# Patient Record
Sex: Male | Born: 1935
Health system: Southern US, Community
[De-identification: ages and names within clinical notes are randomized; demographics above are authoritative.]

## PROBLEM LIST (undated history)

## (undated) DIAGNOSIS — J449 Chronic obstructive pulmonary disease, unspecified: Secondary | ICD-10-CM

## (undated) DIAGNOSIS — I480 Paroxysmal atrial fibrillation: Secondary | ICD-10-CM

## (undated) DIAGNOSIS — I10 Essential (primary) hypertension: Secondary | ICD-10-CM

## (undated) DIAGNOSIS — G629 Polyneuropathy, unspecified: Secondary | ICD-10-CM

## (undated) HISTORY — PX: TONSILLECTOMY: SUR1361

## (undated) HISTORY — DX: Paroxysmal atrial fibrillation: I48.0

## (undated) HISTORY — DX: Essential (primary) hypertension: I10

## (undated) HISTORY — DX: Polyneuropathy, unspecified: G62.9

---

## 2013-02-27 ENCOUNTER — Encounter (INDEPENDENT_AMBULATORY_CARE_PROVIDER_SITE_OTHER): Payer: Self-pay | Admitting: *Deleted

## 2013-03-12 ENCOUNTER — Encounter (HOSPITAL_COMMUNITY): Payer: Self-pay | Admitting: Pharmacy Technician

## 2013-03-12 ENCOUNTER — Encounter (INDEPENDENT_AMBULATORY_CARE_PROVIDER_SITE_OTHER): Payer: Self-pay | Admitting: *Deleted

## 2013-03-12 ENCOUNTER — Encounter (INDEPENDENT_AMBULATORY_CARE_PROVIDER_SITE_OTHER): Payer: Self-pay | Admitting: Internal Medicine

## 2013-03-12 ENCOUNTER — Ambulatory Visit (INDEPENDENT_AMBULATORY_CARE_PROVIDER_SITE_OTHER): Payer: Medicare Other | Admitting: Internal Medicine

## 2013-03-12 ENCOUNTER — Other Ambulatory Visit (INDEPENDENT_AMBULATORY_CARE_PROVIDER_SITE_OTHER): Payer: Self-pay | Admitting: *Deleted

## 2013-03-12 VITALS — BP 156/72 | HR 76 | Ht 70.0 in | Wt 218.1 lb

## 2013-03-12 DIAGNOSIS — R05 Cough: Secondary | ICD-10-CM

## 2013-03-12 DIAGNOSIS — K219 Gastro-esophageal reflux disease without esophagitis: Secondary | ICD-10-CM

## 2013-03-12 DIAGNOSIS — R059 Cough, unspecified: Secondary | ICD-10-CM

## 2013-03-12 DIAGNOSIS — I10 Essential (primary) hypertension: Secondary | ICD-10-CM

## 2013-03-12 NOTE — Patient Instructions (Addendum)
EGD with Dr. Rehman. 

## 2013-03-12 NOTE — Progress Notes (Signed)
Subjective:     Patient ID: Isaiah Brady, male   DOB: 08-07-1933, 79 y.o.   MRN: [id number]  HPIReferred to our office for a cough. He is coughing up mucous. Worse while eating. No cough at bedtime. Symptoms greater than 5 months. No acid reflux. He has tried Mucinex but did not help. His b/p medication was changed but there was no change in his cough.  Sometimes the cough is constant. Appetite is good. NO weight loss. No abdominal pain.  No prior hx of EGD. Recent chest xray was normal.  Last colonoscopy in 2008 Dr. Cleotis Nipper 2008. No acid reflux with the Omeprazole.    12/14/2012 CT chest w/o CM; Left lower lobe cylindrical type bronchiectasis, patchy areas of rt lung ground-glass attenuation and bilateral peripheral interstitial reticulation is identified. Findings are suspicious for early interstitial lung disease.  The small ground-glass attenuation nodules within the rt upper lobe are favored to be the sequela of inflammation or infection. The largest measures 6mm. Coronary artery calcifications.  12/10/12 H and H 13.0 and 40.4, MCV 90, Platelet ct 289 Review of Systems See hpi Current Outpatient Prescriptions  Medication Sig Dispense Refill  . ALPRAZolam (XANAX) 1 MG tablet Take 1 mg by mouth 2 (two) times daily before a meal.      . amitriptyline (ELAVIL) 50 MG tablet Take 50 mg by mouth at bedtime.      Marland Kitchen aspirin 81 MG tablet Take 81 mg by mouth daily.      . fish oil-omega-3 fatty acids 1000 MG capsule Take by mouth 2 (two) times daily before a meal.      . FLUoxetine (PROZAC) 20 MG capsule Take 20 mg by mouth daily.      Marland Kitchen gabapentin (NEURONTIN) 300 MG capsule Take 300 mg by mouth 3 (three) times daily.       . indapamide (LOZOL) 2.5 MG tablet Take 2.5 mg by mouth daily.      Marland Kitchen losartan (COZAAR) 100 MG tablet Take 100 mg by mouth daily.      Marland Kitchen omeprazole (PRILOSEC) 20 MG capsule Take 20 mg by mouth 2 (two) times daily before a meal.      . traMADol-acetaminophen (ULTRACET) 37.5-325  MG per tablet Take 1 tablet by mouth every 6 (six) hours as needed for pain.      . vitamin B-12 (CYANOCOBALAMIN) 1000 MCG tablet Take 1,000 mcg by mouth daily.       No current facility-administered medications for this visit.   Past Medical History  Diagnosis Date  . Hypertension   . Peripheral neuropathy    Past Surgical History  Procedure Laterality Date  . Tonsillectomy     No Known Allergies     Objective:   Physical Exam  Filed Vitals:   03/12/13 0951  BP: 156/72  Pulse: 76  Height: 5\' 10"  (1.778 m)  Weight: 218 lb 1.6 oz (98.93 kg)   Alert and oriented. Skin warm and dry. Oral mucosa is moist.   . Sclera anicteric, conjunctivae is pink. Thyroid not enlarged. No cervical lymphadenopathy. Lungs clear. Heart regular rate and rhythm.  Abdomen is soft. Bowel sounds are positive. No hepatomegaly. No abdominal masses felt. No tenderness.  No edema to lower extremities.        Assessment:   Cough, GERD. PUD needs to be ruled out. I discussed this case with Dr. Karilyn Cota.    Plan:    EGD with Dr. Karilyn Cota

## 2013-03-14 ENCOUNTER — Encounter (INDEPENDENT_AMBULATORY_CARE_PROVIDER_SITE_OTHER): Payer: Self-pay

## 2013-03-23 ENCOUNTER — Encounter (HOSPITAL_COMMUNITY)
Admission: RE | Disposition: A | Discharge status: Home / Self Care | Payer: Self-pay | Source: Ambulatory Visit | Attending: Internal Medicine

## 2013-03-23 ENCOUNTER — Encounter (HOSPITAL_COMMUNITY): Payer: Self-pay | Admitting: *Deleted

## 2013-03-23 ENCOUNTER — Ambulatory Visit (HOSPITAL_COMMUNITY)
Admission: RE | Admit: 2013-03-23 | Discharge: 2013-03-23 | Disposition: A | Discharge status: Home / Self Care | Payer: Medicare Other | Source: Ambulatory Visit | Attending: Internal Medicine | Admitting: Internal Medicine

## 2013-03-23 DIAGNOSIS — K2100 Gastro-esophageal reflux disease with esophagitis, without bleeding: Secondary | ICD-10-CM | POA: Insufficient documentation

## 2013-03-23 DIAGNOSIS — Z79899 Other long term (current) drug therapy: Secondary | ICD-10-CM | POA: Insufficient documentation

## 2013-03-23 DIAGNOSIS — R1310 Dysphagia, unspecified: Secondary | ICD-10-CM | POA: Insufficient documentation

## 2013-03-23 DIAGNOSIS — G609 Hereditary and idiopathic neuropathy, unspecified: Secondary | ICD-10-CM | POA: Insufficient documentation

## 2013-03-23 DIAGNOSIS — K209 Esophagitis, unspecified without bleeding: Secondary | ICD-10-CM

## 2013-03-23 DIAGNOSIS — R05 Cough: Secondary | ICD-10-CM

## 2013-03-23 DIAGNOSIS — K219 Gastro-esophageal reflux disease without esophagitis: Secondary | ICD-10-CM

## 2013-03-23 DIAGNOSIS — Z7982 Long term (current) use of aspirin: Secondary | ICD-10-CM | POA: Insufficient documentation

## 2013-03-23 DIAGNOSIS — R059 Cough, unspecified: Secondary | ICD-10-CM

## 2013-03-23 DIAGNOSIS — I10 Essential (primary) hypertension: Secondary | ICD-10-CM | POA: Insufficient documentation

## 2013-03-23 DIAGNOSIS — R12 Heartburn: Secondary | ICD-10-CM | POA: Insufficient documentation

## 2013-03-23 HISTORY — PX: ESOPHAGOGASTRODUODENOSCOPY: SHX5428

## 2013-03-23 SURGERY — EGD (ESOPHAGOGASTRODUODENOSCOPY)
Anesthesia: Moderate Sedation

## 2013-03-23 MED ORDER — LEVOFLOXACIN 500 MG PO TABS: 500.0000 mg | ORAL_TABLET | Freq: Every day | ORAL | Status: DC

## 2013-03-23 MED ORDER — SODIUM CHLORIDE 0.9 % IV SOLN
INTRAVENOUS | Status: DC
  Administered 2013-03-23: 11:00:00 via INTRAVENOUS

## 2013-03-23 MED ORDER — MIDAZOLAM HCL 5 MG/5ML IJ SOLN
INTRAMUSCULAR | Status: AC
  Filled 2013-03-23: qty 10

## 2013-03-23 MED ORDER — STERILE WATER FOR IRRIGATION IR SOLN
Status: DC | PRN
  Administered 2013-03-23: 11:00:00

## 2013-03-23 MED ORDER — MIDAZOLAM HCL 5 MG/5ML IJ SOLN
INTRAMUSCULAR | Status: DC | PRN
  Administered 2013-03-23 (×2): 2 mg via INTRAVENOUS

## 2013-03-23 MED ORDER — MEPERIDINE HCL 25 MG/ML IJ SOLN
INTRAMUSCULAR | Status: DC | PRN
  Administered 2013-03-23: 25 mg via INTRAVENOUS

## 2013-03-23 MED ORDER — MEPERIDINE HCL 50 MG/ML IJ SOLN
INTRAMUSCULAR | Status: AC
  Filled 2013-03-23: qty 1

## 2013-03-23 NOTE — Op Note (Signed)
EGD PROCEDURE REPORT  PATIENT:  Isaiah Brady [facility]  MR#:  [id number] Birthdate:  01-Sep-1933, 79 y.o., male Endoscopist:  Dr. Malissa Hippo, MD Referred By:  Dr. Donzetta Sprung, MD  Procedure Date: 03/23/2013  Procedure:   EGD  Indications:  Patient is 79 year old Caucasian male who has a chronic cough. He also has GERD. His heartburn has responded therapy for his cough has not. He's been tried on Mucinex and he was switched from ACE inhibitor to another medication but his cough persists. He is undergoing diagnostic EGD. If he has a ring or stricture is esophagus would be dilated. Patient is agreeable.            Informed Consent:  The risks, benefits, alternatives & imponderables which include, but are not limited to, bleeding, infection, perforation, drug reaction and potential missed lesion have been reviewed.  The potential for biopsy, lesion removal, esophageal dilation, etc. have also been discussed.  Questions have been answered.  All parties agreeable.  Please see history & physical in medical record for more information.  Medications:  Demerol 25 mg IV Versed 4 mg IV Cetacaine spray topically for oropharyngeal anesthesia  Description of procedure:  The endoscope was introduced through the mouth and advanced to the second portion of the duodenum without difficulty or limitations. The mucosal surfaces were surveyed very carefully during advancement of the scope and upon withdrawal.  Findings:  Laryngeal inlet appeared to be unremarkable. Creamy secretions noted in the hypopharynx. Esophagus:  Mucosa of the proximal middle and distal third was normal with exception of erythema at GE junction. No ring or stricture was present. GEJ:  39 cm Stomach:  Stomach was empty and distended very well with insufflation. Folds in the proximal stomach are normal. Examination of mucosa at gastric body, antrum, pylorus, angularis fundus and cardia was normal. Duodenum:  Normal bulbar and post bulbar  mucosa.  Therapeutic/Diagnostic Maneuvers Performed:  None  Complications:  None  Impression: Mild changes of reflux esophagitis limited to GE junction. Normal examination of stomach first and second part of the duodenum. Creamy secretions noted in hypopharynx. ? Source lungs.  Recommendations:  Patient will continue anti-reflux measures and omeprazole at 20 mg twice a day. Will initiate antibiotic therapy after consultation with Dr. Donzetta Sprung and if he does not respond he will need consultation with pulmonologist.  Lionel December U  03/23/2013  11:28 AM  CC: Dr. Donzetta Sprung, MD & Dr. Bonnetta Barry ref. provider found

## 2013-03-23 NOTE — H&P (Signed)
Isaiah Brady is an 79 y.o. male.   Chief Complaint: Patient's here for EGD and possible ED. HPI: Patient is 79 and showed Caucasian male who presents with long history of coughing spells. He has not responded therapy. He was having intermittent heartburn which has resolved with omeprazole but his cough has not improved. He complains of a tickle in his throat. At times he has difficulty swallowing food. He feels that go slowly passes suprasternal area. He denies nausea vomiting abdominal pain or melena. When he coughs he drinks about clear fluid.  Past Medical History  Diagnosis Date  . Hypertension   . Peripheral neuropathy     Past Surgical History  Procedure Laterality Date  . Tonsillectomy      History reviewed. No pertinent family history. Social History:  reports that he has quit smoking. He does not have any smokeless tobacco history on file. He reports that he does not drink alcohol or use illicit drugs.  Allergies: No Known Allergies  Medications Prior to Admission  Medication Sig Dispense Refill  . ALPRAZolam (XANAX) 1 MG tablet Take 1 mg by mouth 2 (two) times daily before a meal.      . amitriptyline (ELAVIL) 50 MG tablet Take 50 mg by mouth at bedtime.      Marland Kitchen aspirin 81 MG tablet Take 81 mg by mouth daily.      . fish oil-omega-3 fatty acids 1000 MG capsule Take by mouth 2 (two) times daily before a meal.      . FLUoxetine (PROZAC) 20 MG capsule Take 20 mg by mouth daily.      Marland Kitchen gabapentin (NEURONTIN) 300 MG capsule Take 300 mg by mouth 3 (three) times daily.       . indapamide (LOZOL) 2.5 MG tablet Take 2.5 mg by mouth daily.      Marland Kitchen losartan (COZAAR) 100 MG tablet Take 100 mg by mouth daily.      Marland Kitchen omeprazole (PRILOSEC) 20 MG capsule Take 20 mg by mouth 2 (two) times daily before a meal.      . traMADol-acetaminophen (ULTRACET) 37.5-325 MG per tablet Take 1 tablet by mouth every 6 (six) hours as needed for pain.      . vitamin B-12 (CYANOCOBALAMIN) 1000 MCG tablet Take  1,000 mcg by mouth daily.        No results found for this or any previous visit (from the past 48 hour(s)). No results found.  ROS  Blood pressure 150/81, temperature 97.8 F (36.6 C), temperature source Oral, resp. rate 22, height 5\' 10"  (1.778 m), weight 215 lb (97.523 kg), SpO2 94.00%. Physical Exam  Constitutional: He appears well-developed and well-nourished.  HENT:  Mouth/Throat: Oropharynx is clear and moist.  Eyes: Conjunctivae are normal. No scleral icterus.  Neck: No thyromegaly present.  Cardiovascular: Normal rate, regular rhythm and normal heart sounds.   No murmur heard. Respiratory: Effort normal and breath sounds normal.  GI: Soft. He exhibits no distension and no mass. There is no tenderness.  Musculoskeletal: He exhibits no edema.  Lymphadenopathy:    He has no cervical adenopathy.  Neurological: He is alert.  Skin: Skin is warm and dry.     Assessment/Plan Chronic cough unresponsive to PPI. EGD, possible ED.  Isaiah Brady U 03/23/2013, 11:02 AM

## 2013-03-26 ENCOUNTER — Encounter (HOSPITAL_COMMUNITY): Payer: Self-pay | Admitting: Internal Medicine

## 2016-01-19 DIAGNOSIS — I10 Essential (primary) hypertension: Secondary | ICD-10-CM | POA: Diagnosis not present

## 2016-01-19 DIAGNOSIS — R7301 Impaired fasting glucose: Secondary | ICD-10-CM | POA: Diagnosis not present

## 2016-01-19 DIAGNOSIS — E782 Mixed hyperlipidemia: Secondary | ICD-10-CM | POA: Diagnosis not present

## 2016-01-22 DIAGNOSIS — K219 Gastro-esophageal reflux disease without esophagitis: Secondary | ICD-10-CM | POA: Diagnosis not present

## 2016-01-22 DIAGNOSIS — E782 Mixed hyperlipidemia: Secondary | ICD-10-CM | POA: Diagnosis not present

## 2016-01-22 DIAGNOSIS — I10 Essential (primary) hypertension: Secondary | ICD-10-CM | POA: Diagnosis not present

## 2016-01-22 DIAGNOSIS — Z1212 Encounter for screening for malignant neoplasm of rectum: Secondary | ICD-10-CM | POA: Diagnosis not present

## 2016-01-22 DIAGNOSIS — E8881 Metabolic syndrome: Secondary | ICD-10-CM | POA: Diagnosis not present

## 2016-01-22 DIAGNOSIS — N183 Chronic kidney disease, stage 3 (moderate): Secondary | ICD-10-CM | POA: Diagnosis not present

## 2016-01-22 DIAGNOSIS — Z6834 Body mass index (BMI) 34.0-34.9, adult: Secondary | ICD-10-CM | POA: Diagnosis not present

## 2016-01-22 DIAGNOSIS — F331 Major depressive disorder, recurrent, moderate: Secondary | ICD-10-CM | POA: Diagnosis not present

## 2016-05-19 DIAGNOSIS — Z6835 Body mass index (BMI) 35.0-35.9, adult: Secondary | ICD-10-CM | POA: Diagnosis not present

## 2016-05-19 DIAGNOSIS — E8881 Metabolic syndrome: Secondary | ICD-10-CM | POA: Diagnosis not present

## 2016-05-19 DIAGNOSIS — I10 Essential (primary) hypertension: Secondary | ICD-10-CM | POA: Diagnosis not present

## 2016-05-19 DIAGNOSIS — K219 Gastro-esophageal reflux disease without esophagitis: Secondary | ICD-10-CM | POA: Diagnosis not present

## 2016-05-19 DIAGNOSIS — L400 Psoriasis vulgaris: Secondary | ICD-10-CM | POA: Diagnosis not present

## 2016-05-19 DIAGNOSIS — E782 Mixed hyperlipidemia: Secondary | ICD-10-CM | POA: Diagnosis not present

## 2016-05-19 DIAGNOSIS — N183 Chronic kidney disease, stage 3 (moderate): Secondary | ICD-10-CM | POA: Diagnosis not present

## 2016-05-19 DIAGNOSIS — F331 Major depressive disorder, recurrent, moderate: Secondary | ICD-10-CM | POA: Diagnosis not present

## 2016-08-24 DIAGNOSIS — H35033 Hypertensive retinopathy, bilateral: Secondary | ICD-10-CM | POA: Diagnosis not present

## 2016-08-24 DIAGNOSIS — H25012 Cortical age-related cataract, left eye: Secondary | ICD-10-CM | POA: Diagnosis not present

## 2016-08-24 DIAGNOSIS — H2513 Age-related nuclear cataract, bilateral: Secondary | ICD-10-CM | POA: Diagnosis not present

## 2016-08-24 DIAGNOSIS — H25013 Cortical age-related cataract, bilateral: Secondary | ICD-10-CM | POA: Diagnosis not present

## 2016-08-24 DIAGNOSIS — H2512 Age-related nuclear cataract, left eye: Secondary | ICD-10-CM | POA: Diagnosis not present

## 2016-08-26 DIAGNOSIS — Z23 Encounter for immunization: Secondary | ICD-10-CM | POA: Diagnosis not present

## 2016-09-02 DIAGNOSIS — H2513 Age-related nuclear cataract, bilateral: Secondary | ICD-10-CM | POA: Diagnosis not present

## 2016-09-02 DIAGNOSIS — H25012 Cortical age-related cataract, left eye: Secondary | ICD-10-CM | POA: Diagnosis not present

## 2016-09-02 DIAGNOSIS — H2512 Age-related nuclear cataract, left eye: Secondary | ICD-10-CM | POA: Diagnosis not present

## 2016-09-02 DIAGNOSIS — H25013 Cortical age-related cataract, bilateral: Secondary | ICD-10-CM | POA: Diagnosis not present

## 2016-09-25 DIAGNOSIS — R0902 Hypoxemia: Secondary | ICD-10-CM | POA: Diagnosis not present

## 2016-09-25 DIAGNOSIS — I509 Heart failure, unspecified: Secondary | ICD-10-CM | POA: Diagnosis not present

## 2016-09-25 DIAGNOSIS — L03115 Cellulitis of right lower limb: Secondary | ICD-10-CM | POA: Diagnosis not present

## 2016-09-25 DIAGNOSIS — R6883 Chills (without fever): Secondary | ICD-10-CM | POA: Diagnosis not present

## 2016-09-25 DIAGNOSIS — F339 Major depressive disorder, recurrent, unspecified: Secondary | ICD-10-CM | POA: Diagnosis not present

## 2016-09-25 DIAGNOSIS — I129 Hypertensive chronic kidney disease with stage 1 through stage 4 chronic kidney disease, or unspecified chronic kidney disease: Secondary | ICD-10-CM | POA: Diagnosis not present

## 2016-09-25 DIAGNOSIS — J9601 Acute respiratory failure with hypoxia: Secondary | ICD-10-CM | POA: Diagnosis not present

## 2016-09-25 DIAGNOSIS — R0682 Tachypnea, not elsewhere classified: Secondary | ICD-10-CM | POA: Diagnosis not present

## 2016-09-25 DIAGNOSIS — E785 Hyperlipidemia, unspecified: Secondary | ICD-10-CM | POA: Diagnosis not present

## 2016-09-25 DIAGNOSIS — R6520 Severe sepsis without septic shock: Secondary | ICD-10-CM | POA: Diagnosis not present

## 2016-09-25 DIAGNOSIS — J111 Influenza due to unidentified influenza virus with other respiratory manifestations: Secondary | ICD-10-CM | POA: Diagnosis not present

## 2016-09-25 DIAGNOSIS — A419 Sepsis, unspecified organism: Secondary | ICD-10-CM | POA: Diagnosis not present

## 2016-09-25 DIAGNOSIS — J449 Chronic obstructive pulmonary disease, unspecified: Secondary | ICD-10-CM | POA: Diagnosis not present

## 2016-09-25 DIAGNOSIS — L03116 Cellulitis of left lower limb: Secondary | ICD-10-CM | POA: Diagnosis not present

## 2016-09-25 DIAGNOSIS — L0291 Cutaneous abscess, unspecified: Secondary | ICD-10-CM | POA: Diagnosis not present

## 2016-09-25 DIAGNOSIS — B954 Other streptococcus as the cause of diseases classified elsewhere: Secondary | ICD-10-CM | POA: Diagnosis not present

## 2016-09-25 DIAGNOSIS — N183 Chronic kidney disease, stage 3 (moderate): Secondary | ICD-10-CM | POA: Diagnosis not present

## 2016-09-25 DIAGNOSIS — J189 Pneumonia, unspecified organism: Secondary | ICD-10-CM | POA: Diagnosis not present

## 2016-09-26 DIAGNOSIS — L0291 Cutaneous abscess, unspecified: Secondary | ICD-10-CM | POA: Diagnosis not present

## 2016-09-26 DIAGNOSIS — J9601 Acute respiratory failure with hypoxia: Secondary | ICD-10-CM | POA: Diagnosis not present

## 2016-09-27 DIAGNOSIS — J9601 Acute respiratory failure with hypoxia: Secondary | ICD-10-CM | POA: Diagnosis not present

## 2016-09-27 DIAGNOSIS — L0291 Cutaneous abscess, unspecified: Secondary | ICD-10-CM | POA: Diagnosis not present

## 2016-09-28 DIAGNOSIS — J9601 Acute respiratory failure with hypoxia: Secondary | ICD-10-CM | POA: Diagnosis not present

## 2016-09-28 DIAGNOSIS — J449 Chronic obstructive pulmonary disease, unspecified: Secondary | ICD-10-CM | POA: Diagnosis not present

## 2016-09-28 DIAGNOSIS — L0291 Cutaneous abscess, unspecified: Secondary | ICD-10-CM | POA: Diagnosis not present

## 2016-09-29 DIAGNOSIS — G609 Hereditary and idiopathic neuropathy, unspecified: Secondary | ICD-10-CM | POA: Diagnosis not present

## 2016-09-29 DIAGNOSIS — L309 Dermatitis, unspecified: Secondary | ICD-10-CM | POA: Diagnosis not present

## 2016-09-29 DIAGNOSIS — L03116 Cellulitis of left lower limb: Secondary | ICD-10-CM | POA: Diagnosis not present

## 2016-09-29 DIAGNOSIS — M1A9XX0 Chronic gout, unspecified, without tophus (tophi): Secondary | ICD-10-CM | POA: Diagnosis not present

## 2016-09-29 DIAGNOSIS — I129 Hypertensive chronic kidney disease with stage 1 through stage 4 chronic kidney disease, or unspecified chronic kidney disease: Secondary | ICD-10-CM | POA: Diagnosis not present

## 2016-09-29 DIAGNOSIS — L03115 Cellulitis of right lower limb: Secondary | ICD-10-CM | POA: Diagnosis not present

## 2016-09-29 DIAGNOSIS — F3340 Major depressive disorder, recurrent, in remission, unspecified: Secondary | ICD-10-CM | POA: Diagnosis not present

## 2016-09-29 DIAGNOSIS — N183 Chronic kidney disease, stage 3 (moderate): Secondary | ICD-10-CM | POA: Diagnosis not present

## 2016-09-29 DIAGNOSIS — J449 Chronic obstructive pulmonary disease, unspecified: Secondary | ICD-10-CM | POA: Diagnosis not present

## 2016-09-29 DIAGNOSIS — K219 Gastro-esophageal reflux disease without esophagitis: Secondary | ICD-10-CM | POA: Diagnosis not present

## 2016-10-25 DIAGNOSIS — Z9189 Other specified personal risk factors, not elsewhere classified: Secondary | ICD-10-CM | POA: Diagnosis not present

## 2016-10-25 DIAGNOSIS — N183 Chronic kidney disease, stage 3 (moderate): Secondary | ICD-10-CM | POA: Diagnosis not present

## 2016-10-25 DIAGNOSIS — I10 Essential (primary) hypertension: Secondary | ICD-10-CM | POA: Diagnosis not present

## 2016-10-25 DIAGNOSIS — E8881 Metabolic syndrome: Secondary | ICD-10-CM | POA: Diagnosis not present

## 2016-10-25 DIAGNOSIS — K210 Gastro-esophageal reflux disease with esophagitis: Secondary | ICD-10-CM | POA: Diagnosis not present

## 2016-10-25 DIAGNOSIS — E782 Mixed hyperlipidemia: Secondary | ICD-10-CM | POA: Diagnosis not present

## 2016-10-25 DIAGNOSIS — R7301 Impaired fasting glucose: Secondary | ICD-10-CM | POA: Diagnosis not present

## 2016-10-26 DIAGNOSIS — H2512 Age-related nuclear cataract, left eye: Secondary | ICD-10-CM | POA: Diagnosis not present

## 2016-10-26 DIAGNOSIS — H25012 Cortical age-related cataract, left eye: Secondary | ICD-10-CM | POA: Diagnosis not present

## 2016-10-26 DIAGNOSIS — H25812 Combined forms of age-related cataract, left eye: Secondary | ICD-10-CM | POA: Diagnosis not present

## 2016-10-28 DIAGNOSIS — F331 Major depressive disorder, recurrent, moderate: Secondary | ICD-10-CM | POA: Diagnosis not present

## 2016-10-28 DIAGNOSIS — I10 Essential (primary) hypertension: Secondary | ICD-10-CM | POA: Diagnosis not present

## 2016-10-28 DIAGNOSIS — K219 Gastro-esophageal reflux disease without esophagitis: Secondary | ICD-10-CM | POA: Diagnosis not present

## 2016-10-28 DIAGNOSIS — Z6834 Body mass index (BMI) 34.0-34.9, adult: Secondary | ICD-10-CM | POA: Diagnosis not present

## 2016-10-28 DIAGNOSIS — Z1212 Encounter for screening for malignant neoplasm of rectum: Secondary | ICD-10-CM | POA: Diagnosis not present

## 2016-10-28 DIAGNOSIS — J449 Chronic obstructive pulmonary disease, unspecified: Secondary | ICD-10-CM | POA: Diagnosis not present

## 2016-10-28 DIAGNOSIS — L03116 Cellulitis of left lower limb: Secondary | ICD-10-CM | POA: Diagnosis not present

## 2016-10-28 DIAGNOSIS — Z0001 Encounter for general adult medical examination with abnormal findings: Secondary | ICD-10-CM | POA: Diagnosis not present

## 2016-10-28 DIAGNOSIS — E8881 Metabolic syndrome: Secondary | ICD-10-CM | POA: Diagnosis not present

## 2016-10-28 DIAGNOSIS — G4733 Obstructive sleep apnea (adult) (pediatric): Secondary | ICD-10-CM | POA: Diagnosis not present

## 2016-10-28 DIAGNOSIS — E782 Mixed hyperlipidemia: Secondary | ICD-10-CM | POA: Diagnosis not present

## 2016-11-10 DIAGNOSIS — Z6834 Body mass index (BMI) 34.0-34.9, adult: Secondary | ICD-10-CM | POA: Diagnosis not present

## 2016-11-10 DIAGNOSIS — R05 Cough: Secondary | ICD-10-CM | POA: Diagnosis not present

## 2016-11-28 DIAGNOSIS — J449 Chronic obstructive pulmonary disease, unspecified: Secondary | ICD-10-CM | POA: Diagnosis not present

## 2016-11-30 DIAGNOSIS — I10 Essential (primary) hypertension: Secondary | ICD-10-CM | POA: Diagnosis not present

## 2016-11-30 DIAGNOSIS — R7301 Impaired fasting glucose: Secondary | ICD-10-CM | POA: Diagnosis not present

## 2016-11-30 DIAGNOSIS — K219 Gastro-esophageal reflux disease without esophagitis: Secondary | ICD-10-CM | POA: Diagnosis not present

## 2016-11-30 DIAGNOSIS — L400 Psoriasis vulgaris: Secondary | ICD-10-CM | POA: Diagnosis not present

## 2016-11-30 DIAGNOSIS — E8881 Metabolic syndrome: Secondary | ICD-10-CM | POA: Diagnosis not present

## 2016-11-30 DIAGNOSIS — F331 Major depressive disorder, recurrent, moderate: Secondary | ICD-10-CM | POA: Diagnosis not present

## 2016-11-30 DIAGNOSIS — N183 Chronic kidney disease, stage 3 (moderate): Secondary | ICD-10-CM | POA: Diagnosis not present

## 2016-11-30 DIAGNOSIS — E782 Mixed hyperlipidemia: Secondary | ICD-10-CM | POA: Diagnosis not present

## 2016-12-29 DIAGNOSIS — J449 Chronic obstructive pulmonary disease, unspecified: Secondary | ICD-10-CM | POA: Diagnosis not present

## 2017-01-20 DIAGNOSIS — L03116 Cellulitis of left lower limb: Secondary | ICD-10-CM | POA: Diagnosis not present

## 2017-01-20 DIAGNOSIS — Z6834 Body mass index (BMI) 34.0-34.9, adult: Secondary | ICD-10-CM | POA: Diagnosis not present

## 2017-01-20 DIAGNOSIS — I10 Essential (primary) hypertension: Secondary | ICD-10-CM | POA: Diagnosis not present

## 2017-01-20 DIAGNOSIS — L03115 Cellulitis of right lower limb: Secondary | ICD-10-CM | POA: Diagnosis not present

## 2017-01-26 DIAGNOSIS — J449 Chronic obstructive pulmonary disease, unspecified: Secondary | ICD-10-CM | POA: Diagnosis not present

## 2017-01-28 DIAGNOSIS — R531 Weakness: Secondary | ICD-10-CM | POA: Diagnosis not present

## 2017-01-28 DIAGNOSIS — Z87891 Personal history of nicotine dependence: Secondary | ICD-10-CM | POA: Diagnosis not present

## 2017-01-28 DIAGNOSIS — A419 Sepsis, unspecified organism: Secondary | ICD-10-CM | POA: Diagnosis not present

## 2017-01-28 DIAGNOSIS — R05 Cough: Secondary | ICD-10-CM | POA: Diagnosis not present

## 2017-01-28 DIAGNOSIS — R404 Transient alteration of awareness: Secondary | ICD-10-CM | POA: Diagnosis not present

## 2017-01-28 DIAGNOSIS — J156 Pneumonia due to other aerobic Gram-negative bacteria: Secondary | ICD-10-CM | POA: Diagnosis not present

## 2017-01-28 DIAGNOSIS — R509 Fever, unspecified: Secondary | ICD-10-CM | POA: Diagnosis not present

## 2017-01-28 DIAGNOSIS — J189 Pneumonia, unspecified organism: Secondary | ICD-10-CM | POA: Diagnosis not present

## 2017-01-28 DIAGNOSIS — R0902 Hypoxemia: Secondary | ICD-10-CM | POA: Diagnosis not present

## 2017-01-29 DIAGNOSIS — J156 Pneumonia due to other aerobic Gram-negative bacteria: Secondary | ICD-10-CM | POA: Diagnosis not present

## 2017-01-29 DIAGNOSIS — A419 Sepsis, unspecified organism: Secondary | ICD-10-CM | POA: Diagnosis not present

## 2017-01-30 DIAGNOSIS — J156 Pneumonia due to other aerobic Gram-negative bacteria: Secondary | ICD-10-CM | POA: Diagnosis not present

## 2017-01-30 DIAGNOSIS — A419 Sepsis, unspecified organism: Secondary | ICD-10-CM | POA: Diagnosis not present

## 2017-01-31 DIAGNOSIS — A419 Sepsis, unspecified organism: Secondary | ICD-10-CM | POA: Diagnosis not present

## 2017-01-31 DIAGNOSIS — J156 Pneumonia due to other aerobic Gram-negative bacteria: Secondary | ICD-10-CM | POA: Diagnosis not present

## 2017-02-07 DIAGNOSIS — J189 Pneumonia, unspecified organism: Secondary | ICD-10-CM | POA: Diagnosis not present

## 2017-02-26 DIAGNOSIS — J449 Chronic obstructive pulmonary disease, unspecified: Secondary | ICD-10-CM | POA: Diagnosis not present

## 2017-03-02 DIAGNOSIS — H109 Unspecified conjunctivitis: Secondary | ICD-10-CM | POA: Diagnosis not present

## 2017-03-02 DIAGNOSIS — H6123 Impacted cerumen, bilateral: Secondary | ICD-10-CM | POA: Diagnosis not present

## 2017-03-02 DIAGNOSIS — Z6833 Body mass index (BMI) 33.0-33.9, adult: Secondary | ICD-10-CM | POA: Diagnosis not present

## 2017-03-02 DIAGNOSIS — J189 Pneumonia, unspecified organism: Secondary | ICD-10-CM | POA: Diagnosis not present

## 2017-03-25 DIAGNOSIS — E782 Mixed hyperlipidemia: Secondary | ICD-10-CM | POA: Diagnosis not present

## 2017-03-25 DIAGNOSIS — E8881 Metabolic syndrome: Secondary | ICD-10-CM | POA: Diagnosis not present

## 2017-03-25 DIAGNOSIS — G4733 Obstructive sleep apnea (adult) (pediatric): Secondary | ICD-10-CM | POA: Diagnosis not present

## 2017-03-25 DIAGNOSIS — F331 Major depressive disorder, recurrent, moderate: Secondary | ICD-10-CM | POA: Diagnosis not present

## 2017-03-28 DIAGNOSIS — J449 Chronic obstructive pulmonary disease, unspecified: Secondary | ICD-10-CM | POA: Diagnosis not present

## 2017-03-30 DIAGNOSIS — E782 Mixed hyperlipidemia: Secondary | ICD-10-CM | POA: Diagnosis not present

## 2017-03-30 DIAGNOSIS — I10 Essential (primary) hypertension: Secondary | ICD-10-CM | POA: Diagnosis not present

## 2017-03-30 DIAGNOSIS — F331 Major depressive disorder, recurrent, moderate: Secondary | ICD-10-CM | POA: Diagnosis not present

## 2017-03-30 DIAGNOSIS — E8881 Metabolic syndrome: Secondary | ICD-10-CM | POA: Diagnosis not present

## 2017-04-22 DIAGNOSIS — Z6830 Body mass index (BMI) 30.0-30.9, adult: Secondary | ICD-10-CM | POA: Diagnosis not present

## 2017-04-22 DIAGNOSIS — L03116 Cellulitis of left lower limb: Secondary | ICD-10-CM | POA: Diagnosis not present

## 2017-04-22 DIAGNOSIS — L03115 Cellulitis of right lower limb: Secondary | ICD-10-CM | POA: Diagnosis not present

## 2017-04-28 DIAGNOSIS — J449 Chronic obstructive pulmonary disease, unspecified: Secondary | ICD-10-CM | POA: Diagnosis not present

## 2017-05-28 DIAGNOSIS — J449 Chronic obstructive pulmonary disease, unspecified: Secondary | ICD-10-CM | POA: Diagnosis not present

## 2017-06-13 DIAGNOSIS — L03115 Cellulitis of right lower limb: Secondary | ICD-10-CM | POA: Diagnosis not present

## 2017-06-13 DIAGNOSIS — L03116 Cellulitis of left lower limb: Secondary | ICD-10-CM | POA: Diagnosis not present

## 2017-06-13 DIAGNOSIS — Z6830 Body mass index (BMI) 30.0-30.9, adult: Secondary | ICD-10-CM | POA: Diagnosis not present

## 2017-06-20 DIAGNOSIS — E8881 Metabolic syndrome: Secondary | ICD-10-CM | POA: Diagnosis not present

## 2017-06-20 DIAGNOSIS — E782 Mixed hyperlipidemia: Secondary | ICD-10-CM | POA: Diagnosis not present

## 2017-06-20 DIAGNOSIS — I10 Essential (primary) hypertension: Secondary | ICD-10-CM | POA: Diagnosis not present

## 2017-06-20 DIAGNOSIS — N183 Chronic kidney disease, stage 3 (moderate): Secondary | ICD-10-CM | POA: Diagnosis not present

## 2017-06-28 DIAGNOSIS — J449 Chronic obstructive pulmonary disease, unspecified: Secondary | ICD-10-CM | POA: Diagnosis not present

## 2017-07-13 DIAGNOSIS — Z6829 Body mass index (BMI) 29.0-29.9, adult: Secondary | ICD-10-CM | POA: Diagnosis not present

## 2017-07-13 DIAGNOSIS — L400 Psoriasis vulgaris: Secondary | ICD-10-CM | POA: Diagnosis not present

## 2017-07-29 DIAGNOSIS — J449 Chronic obstructive pulmonary disease, unspecified: Secondary | ICD-10-CM | POA: Diagnosis not present

## 2017-08-01 DIAGNOSIS — E782 Mixed hyperlipidemia: Secondary | ICD-10-CM | POA: Diagnosis not present

## 2017-08-01 DIAGNOSIS — I10 Essential (primary) hypertension: Secondary | ICD-10-CM | POA: Diagnosis not present

## 2017-08-01 DIAGNOSIS — E1165 Type 2 diabetes mellitus with hyperglycemia: Secondary | ICD-10-CM | POA: Diagnosis not present

## 2017-08-01 DIAGNOSIS — E6609 Other obesity due to excess calories: Secondary | ICD-10-CM | POA: Diagnosis not present

## 2017-08-05 DIAGNOSIS — E782 Mixed hyperlipidemia: Secondary | ICD-10-CM | POA: Diagnosis not present

## 2017-08-05 DIAGNOSIS — N183 Chronic kidney disease, stage 3 (moderate): Secondary | ICD-10-CM | POA: Diagnosis not present

## 2017-08-05 DIAGNOSIS — I10 Essential (primary) hypertension: Secondary | ICD-10-CM | POA: Diagnosis not present

## 2017-08-05 DIAGNOSIS — E8881 Metabolic syndrome: Secondary | ICD-10-CM | POA: Diagnosis not present

## 2017-08-28 DIAGNOSIS — J449 Chronic obstructive pulmonary disease, unspecified: Secondary | ICD-10-CM | POA: Diagnosis not present

## 2017-09-28 DIAGNOSIS — J449 Chronic obstructive pulmonary disease, unspecified: Secondary | ICD-10-CM | POA: Diagnosis not present

## 2017-10-28 DIAGNOSIS — J449 Chronic obstructive pulmonary disease, unspecified: Secondary | ICD-10-CM | POA: Diagnosis not present

## 2017-11-28 DIAGNOSIS — J449 Chronic obstructive pulmonary disease, unspecified: Secondary | ICD-10-CM | POA: Diagnosis not present

## 2017-11-30 DIAGNOSIS — E039 Hypothyroidism, unspecified: Secondary | ICD-10-CM | POA: Diagnosis not present

## 2017-11-30 DIAGNOSIS — E1165 Type 2 diabetes mellitus with hyperglycemia: Secondary | ICD-10-CM | POA: Diagnosis not present

## 2017-11-30 DIAGNOSIS — E8881 Metabolic syndrome: Secondary | ICD-10-CM | POA: Diagnosis not present

## 2017-11-30 DIAGNOSIS — E782 Mixed hyperlipidemia: Secondary | ICD-10-CM | POA: Diagnosis not present

## 2017-12-06 DIAGNOSIS — E8881 Metabolic syndrome: Secondary | ICD-10-CM | POA: Diagnosis not present

## 2017-12-06 DIAGNOSIS — Z23 Encounter for immunization: Secondary | ICD-10-CM | POA: Diagnosis not present

## 2017-12-06 DIAGNOSIS — Z0001 Encounter for general adult medical examination with abnormal findings: Secondary | ICD-10-CM | POA: Diagnosis not present

## 2017-12-06 DIAGNOSIS — E782 Mixed hyperlipidemia: Secondary | ICD-10-CM | POA: Diagnosis not present

## 2017-12-29 DIAGNOSIS — J449 Chronic obstructive pulmonary disease, unspecified: Secondary | ICD-10-CM | POA: Diagnosis not present

## 2018-01-26 DIAGNOSIS — J449 Chronic obstructive pulmonary disease, unspecified: Secondary | ICD-10-CM | POA: Diagnosis not present

## 2018-02-26 DIAGNOSIS — J449 Chronic obstructive pulmonary disease, unspecified: Secondary | ICD-10-CM | POA: Diagnosis not present

## 2018-03-06 DIAGNOSIS — R296 Repeated falls: Secondary | ICD-10-CM | POA: Diagnosis not present

## 2018-03-06 DIAGNOSIS — J9611 Chronic respiratory failure with hypoxia: Secondary | ICD-10-CM | POA: Diagnosis not present

## 2018-03-06 DIAGNOSIS — Z6833 Body mass index (BMI) 33.0-33.9, adult: Secondary | ICD-10-CM | POA: Diagnosis not present

## 2018-03-06 DIAGNOSIS — I10 Essential (primary) hypertension: Secondary | ICD-10-CM | POA: Diagnosis not present

## 2018-03-06 DIAGNOSIS — E039 Hypothyroidism, unspecified: Secondary | ICD-10-CM | POA: Diagnosis not present

## 2018-03-28 DIAGNOSIS — J449 Chronic obstructive pulmonary disease, unspecified: Secondary | ICD-10-CM | POA: Diagnosis not present

## 2018-04-05 DIAGNOSIS — E782 Mixed hyperlipidemia: Secondary | ICD-10-CM | POA: Diagnosis not present

## 2018-04-05 DIAGNOSIS — N183 Chronic kidney disease, stage 3 (moderate): Secondary | ICD-10-CM | POA: Diagnosis not present

## 2018-04-05 DIAGNOSIS — I10 Essential (primary) hypertension: Secondary | ICD-10-CM | POA: Diagnosis not present

## 2018-04-05 DIAGNOSIS — E8881 Metabolic syndrome: Secondary | ICD-10-CM | POA: Diagnosis not present

## 2018-04-28 DIAGNOSIS — J449 Chronic obstructive pulmonary disease, unspecified: Secondary | ICD-10-CM | POA: Diagnosis not present

## 2018-05-28 DIAGNOSIS — J449 Chronic obstructive pulmonary disease, unspecified: Secondary | ICD-10-CM | POA: Diagnosis not present

## 2018-06-06 DIAGNOSIS — K219 Gastro-esophageal reflux disease without esophagitis: Secondary | ICD-10-CM | POA: Diagnosis not present

## 2018-06-06 DIAGNOSIS — R0602 Shortness of breath: Secondary | ICD-10-CM | POA: Diagnosis not present

## 2018-06-06 DIAGNOSIS — Z6832 Body mass index (BMI) 32.0-32.9, adult: Secondary | ICD-10-CM | POA: Diagnosis not present

## 2018-06-06 DIAGNOSIS — F331 Major depressive disorder, recurrent, moderate: Secondary | ICD-10-CM | POA: Diagnosis not present

## 2018-06-06 DIAGNOSIS — J440 Chronic obstructive pulmonary disease with acute lower respiratory infection: Secondary | ICD-10-CM | POA: Diagnosis not present

## 2018-06-06 DIAGNOSIS — I10 Essential (primary) hypertension: Secondary | ICD-10-CM | POA: Diagnosis not present

## 2018-06-28 DIAGNOSIS — J449 Chronic obstructive pulmonary disease, unspecified: Secondary | ICD-10-CM | POA: Diagnosis not present

## 2018-07-05 DIAGNOSIS — J449 Chronic obstructive pulmonary disease, unspecified: Secondary | ICD-10-CM | POA: Diagnosis not present

## 2018-07-05 DIAGNOSIS — F331 Major depressive disorder, recurrent, moderate: Secondary | ICD-10-CM | POA: Diagnosis not present

## 2018-07-05 DIAGNOSIS — I10 Essential (primary) hypertension: Secondary | ICD-10-CM | POA: Diagnosis not present

## 2018-07-06 DIAGNOSIS — M85851 Other specified disorders of bone density and structure, right thigh: Secondary | ICD-10-CM | POA: Diagnosis not present

## 2018-07-23 DIAGNOSIS — I10 Essential (primary) hypertension: Secondary | ICD-10-CM | POA: Diagnosis not present

## 2018-07-23 DIAGNOSIS — F331 Major depressive disorder, recurrent, moderate: Secondary | ICD-10-CM | POA: Diagnosis not present

## 2018-07-23 DIAGNOSIS — J449 Chronic obstructive pulmonary disease, unspecified: Secondary | ICD-10-CM | POA: Diagnosis not present

## 2018-07-29 DIAGNOSIS — J449 Chronic obstructive pulmonary disease, unspecified: Secondary | ICD-10-CM | POA: Diagnosis not present

## 2018-08-01 DIAGNOSIS — E782 Mixed hyperlipidemia: Secondary | ICD-10-CM | POA: Diagnosis not present

## 2018-08-01 DIAGNOSIS — I10 Essential (primary) hypertension: Secondary | ICD-10-CM | POA: Diagnosis not present

## 2018-08-01 DIAGNOSIS — L400 Psoriasis vulgaris: Secondary | ICD-10-CM | POA: Diagnosis not present

## 2018-08-01 DIAGNOSIS — Z23 Encounter for immunization: Secondary | ICD-10-CM | POA: Diagnosis not present

## 2018-08-04 DIAGNOSIS — Z9981 Dependence on supplemental oxygen: Secondary | ICD-10-CM | POA: Diagnosis not present

## 2018-08-04 DIAGNOSIS — F419 Anxiety disorder, unspecified: Secondary | ICD-10-CM | POA: Diagnosis not present

## 2018-08-04 DIAGNOSIS — L409 Psoriasis, unspecified: Secondary | ICD-10-CM | POA: Diagnosis not present

## 2018-08-04 DIAGNOSIS — N170 Acute kidney failure with tubular necrosis: Secondary | ICD-10-CM | POA: Diagnosis not present

## 2018-08-04 DIAGNOSIS — E872 Acidosis: Secondary | ICD-10-CM | POA: Diagnosis not present

## 2018-08-04 DIAGNOSIS — E876 Hypokalemia: Secondary | ICD-10-CM | POA: Diagnosis not present

## 2018-08-04 DIAGNOSIS — T465X5A Adverse effect of other antihypertensive drugs, initial encounter: Secondary | ICD-10-CM | POA: Diagnosis not present

## 2018-08-04 DIAGNOSIS — J9611 Chronic respiratory failure with hypoxia: Secondary | ICD-10-CM | POA: Diagnosis not present

## 2018-08-04 DIAGNOSIS — R531 Weakness: Secondary | ICD-10-CM | POA: Diagnosis not present

## 2018-08-04 DIAGNOSIS — R0902 Hypoxemia: Secondary | ICD-10-CM | POA: Diagnosis not present

## 2018-08-04 DIAGNOSIS — Z7982 Long term (current) use of aspirin: Secondary | ICD-10-CM | POA: Diagnosis not present

## 2018-08-04 DIAGNOSIS — E875 Hyperkalemia: Secondary | ICD-10-CM | POA: Diagnosis not present

## 2018-08-04 DIAGNOSIS — R001 Bradycardia, unspecified: Secondary | ICD-10-CM | POA: Diagnosis not present

## 2018-08-04 DIAGNOSIS — I442 Atrioventricular block, complete: Secondary | ICD-10-CM | POA: Diagnosis not present

## 2018-08-04 DIAGNOSIS — N289 Disorder of kidney and ureter, unspecified: Secondary | ICD-10-CM | POA: Diagnosis not present

## 2018-08-04 DIAGNOSIS — M1A9XX0 Chronic gout, unspecified, without tophus (tophi): Secondary | ICD-10-CM | POA: Diagnosis not present

## 2018-08-04 DIAGNOSIS — I129 Hypertensive chronic kidney disease with stage 1 through stage 4 chronic kidney disease, or unspecified chronic kidney disease: Secondary | ICD-10-CM | POA: Diagnosis not present

## 2018-08-04 DIAGNOSIS — N183 Chronic kidney disease, stage 3 (moderate): Secondary | ICD-10-CM | POA: Diagnosis not present

## 2018-08-04 DIAGNOSIS — J449 Chronic obstructive pulmonary disease, unspecified: Secondary | ICD-10-CM | POA: Diagnosis not present

## 2018-08-04 DIAGNOSIS — W19XXXA Unspecified fall, initial encounter: Secondary | ICD-10-CM | POA: Diagnosis not present

## 2018-08-04 DIAGNOSIS — F331 Major depressive disorder, recurrent, moderate: Secondary | ICD-10-CM | POA: Diagnosis not present

## 2018-08-04 DIAGNOSIS — N179 Acute kidney failure, unspecified: Secondary | ICD-10-CM | POA: Diagnosis not present

## 2018-08-04 DIAGNOSIS — G609 Hereditary and idiopathic neuropathy, unspecified: Secondary | ICD-10-CM | POA: Diagnosis not present

## 2018-08-04 DIAGNOSIS — Z87891 Personal history of nicotine dependence: Secondary | ICD-10-CM | POA: Diagnosis not present

## 2018-08-04 DIAGNOSIS — R0602 Shortness of breath: Secondary | ICD-10-CM | POA: Diagnosis not present

## 2018-08-05 ENCOUNTER — Inpatient Hospital Stay (HOSPITAL_COMMUNITY): Payer: Medicare Other

## 2018-08-05 ENCOUNTER — Inpatient Hospital Stay (HOSPITAL_COMMUNITY)
Admission: AD | Admit: 2018-08-05 | Discharge: 2018-08-10 | DRG: 682 | Disposition: A | Discharge status: Home Health | Payer: Medicare Other | Source: Other Acute Inpatient Hospital | Attending: Internal Medicine | Admitting: Internal Medicine

## 2018-08-05 DIAGNOSIS — E876 Hypokalemia: Secondary | ICD-10-CM | POA: Diagnosis not present

## 2018-08-05 DIAGNOSIS — G9340 Encephalopathy, unspecified: Secondary | ICD-10-CM

## 2018-08-05 DIAGNOSIS — Z8673 Personal history of transient ischemic attack (TIA), and cerebral infarction without residual deficits: Secondary | ICD-10-CM | POA: Diagnosis not present

## 2018-08-05 DIAGNOSIS — N183 Chronic kidney disease, stage 3 (moderate): Secondary | ICD-10-CM | POA: Diagnosis present

## 2018-08-05 DIAGNOSIS — E875 Hyperkalemia: Secondary | ICD-10-CM | POA: Diagnosis not present

## 2018-08-05 DIAGNOSIS — J9601 Acute respiratory failure with hypoxia: Secondary | ICD-10-CM | POA: Diagnosis present

## 2018-08-05 DIAGNOSIS — J810 Acute pulmonary edema: Secondary | ICD-10-CM

## 2018-08-05 DIAGNOSIS — Y832 Surgical operation with anastomosis, bypass or graft as the cause of abnormal reaction of the patient, or of later complication, without mention of misadventure at the time of the procedure: Secondary | ICD-10-CM | POA: Diagnosis not present

## 2018-08-05 DIAGNOSIS — G9341 Metabolic encephalopathy: Secondary | ICD-10-CM | POA: Diagnosis not present

## 2018-08-05 DIAGNOSIS — E785 Hyperlipidemia, unspecified: Secondary | ICD-10-CM | POA: Diagnosis not present

## 2018-08-05 DIAGNOSIS — Z79899 Other long term (current) drug therapy: Secondary | ICD-10-CM | POA: Diagnosis not present

## 2018-08-05 DIAGNOSIS — E1122 Type 2 diabetes mellitus with diabetic chronic kidney disease: Secondary | ICD-10-CM | POA: Diagnosis present

## 2018-08-05 DIAGNOSIS — R410 Disorientation, unspecified: Secondary | ICD-10-CM | POA: Diagnosis not present

## 2018-08-05 DIAGNOSIS — Z8249 Family history of ischemic heart disease and other diseases of the circulatory system: Secondary | ICD-10-CM

## 2018-08-05 DIAGNOSIS — N170 Acute kidney failure with tubular necrosis: Principal | ICD-10-CM | POA: Diagnosis present

## 2018-08-05 DIAGNOSIS — J440 Chronic obstructive pulmonary disease with acute lower respiratory infection: Secondary | ICD-10-CM | POA: Diagnosis present

## 2018-08-05 DIAGNOSIS — I480 Paroxysmal atrial fibrillation: Secondary | ICD-10-CM

## 2018-08-05 DIAGNOSIS — E872 Acidosis, unspecified: Secondary | ICD-10-CM

## 2018-08-05 DIAGNOSIS — J181 Lobar pneumonia, unspecified organism: Secondary | ICD-10-CM | POA: Diagnosis present

## 2018-08-05 DIAGNOSIS — F329 Major depressive disorder, single episode, unspecified: Secondary | ICD-10-CM | POA: Diagnosis not present

## 2018-08-05 DIAGNOSIS — J189 Pneumonia, unspecified organism: Secondary | ICD-10-CM | POA: Diagnosis not present

## 2018-08-05 DIAGNOSIS — E1142 Type 2 diabetes mellitus with diabetic polyneuropathy: Secondary | ICD-10-CM | POA: Diagnosis present

## 2018-08-05 DIAGNOSIS — Z7982 Long term (current) use of aspirin: Secondary | ICD-10-CM | POA: Diagnosis not present

## 2018-08-05 DIAGNOSIS — J811 Chronic pulmonary edema: Secondary | ICD-10-CM

## 2018-08-05 DIAGNOSIS — I272 Pulmonary hypertension, unspecified: Secondary | ICD-10-CM | POA: Diagnosis present

## 2018-08-05 DIAGNOSIS — Z87891 Personal history of nicotine dependence: Secondary | ICD-10-CM

## 2018-08-05 DIAGNOSIS — I130 Hypertensive heart and chronic kidney disease with heart failure and stage 1 through stage 4 chronic kidney disease, or unspecified chronic kidney disease: Secondary | ICD-10-CM | POA: Diagnosis present

## 2018-08-05 DIAGNOSIS — T8241XA Breakdown (mechanical) of vascular dialysis catheter, initial encounter: Secondary | ICD-10-CM | POA: Diagnosis not present

## 2018-08-05 DIAGNOSIS — S0990XA Unspecified injury of head, initial encounter: Secondary | ICD-10-CM | POA: Diagnosis not present

## 2018-08-05 DIAGNOSIS — I509 Heart failure, unspecified: Secondary | ICD-10-CM | POA: Diagnosis present

## 2018-08-05 DIAGNOSIS — R319 Hematuria, unspecified: Secondary | ICD-10-CM | POA: Diagnosis not present

## 2018-08-05 DIAGNOSIS — N179 Acute kidney failure, unspecified: Secondary | ICD-10-CM | POA: Diagnosis not present

## 2018-08-05 DIAGNOSIS — T82528A Displacement of other cardiac and vascular devices and implants, initial encounter: Secondary | ICD-10-CM

## 2018-08-05 DIAGNOSIS — I361 Nonrheumatic tricuspid (valve) insufficiency: Secondary | ICD-10-CM | POA: Diagnosis not present

## 2018-08-05 DIAGNOSIS — Z4682 Encounter for fitting and adjustment of non-vascular catheter: Secondary | ICD-10-CM | POA: Diagnosis not present

## 2018-08-05 HISTORY — DX: Chronic obstructive pulmonary disease, unspecified: J44.9

## 2018-08-05 LAB — PROTIME-INR
INR: 1.2
PROTHROMBIN TIME: 15.1 s (ref 11.4–15.2)

## 2018-08-05 LAB — COMPREHENSIVE METABOLIC PANEL
ALBUMIN: 3 g/dL — AB (ref 3.5–5.0)
ALT: 22 U/L (ref 0–44)
AST: 21 U/L (ref 15–41)
Alkaline Phosphatase: 60 U/L (ref 38–126)
Anion gap: 12 (ref 5–15)
BUN: 84 mg/dL — AB (ref 8–23)
CHLORIDE: 104 mmol/L (ref 98–111)
CO2: 19 mmol/L — ABNORMAL LOW (ref 22–32)
CREATININE: 5.21 mg/dL — AB (ref 0.61–1.24)
Calcium: 9.4 mg/dL (ref 8.9–10.3)
GFR calc Af Amer: 11 mL/min — ABNORMAL LOW (ref >60)
GFR, EST NON AFRICAN AMERICAN: 9 mL/min — AB (ref >60)
GLUCOSE: 217 mg/dL — AB (ref 70–99)
POTASSIUM: 7 mmol/L — AB (ref 3.5–5.1)
Sodium: 135 mmol/L (ref 135–145)
Total Bilirubin: 0.6 mg/dL (ref 0.3–1.2)
Total Protein: 5.8 g/dL — ABNORMAL LOW (ref 6.5–8.1)

## 2018-08-05 LAB — CBC WITH DIFFERENTIAL/PLATELET
Abs Immature Granulocytes: 0.5 10*3/uL — ABNORMAL HIGH (ref 0.0–0.1)
BASOS PCT: 0 %
Basophils Absolute: 0 10*3/uL (ref 0.0–0.1)
EOS ABS: 0 10*3/uL (ref 0.0–0.7)
EOS PCT: 0 %
HEMATOCRIT: 39.1 % (ref 39.0–52.0)
Hemoglobin: 11.6 g/dL — ABNORMAL LOW (ref 13.0–17.0)
Immature Granulocytes: 3 %
LYMPHS ABS: 1.4 10*3/uL (ref 0.7–4.0)
Lymphocytes Relative: 8 %
MCH: 29.9 pg (ref 26.0–34.0)
MCHC: 29.7 g/dL — AB (ref 30.0–36.0)
MCV: 100.8 fL — ABNORMAL HIGH (ref 78.0–100.0)
Monocytes Absolute: 0.5 10*3/uL (ref 0.1–1.0)
Monocytes Relative: 3 %
Neutro Abs: 14 10*3/uL — ABNORMAL HIGH (ref 1.7–7.7)
Neutrophils Relative %: 86 %
PLATELETS: 189 10*3/uL (ref 150–400)
RBC: 3.88 MIL/uL — AB (ref 4.22–5.81)
RDW: 16 % — ABNORMAL HIGH (ref 11.5–15.5)
WBC: 16.3 10*3/uL — AB (ref 4.0–10.5)

## 2018-08-05 LAB — POCT I-STAT 3, ART BLOOD GAS (G3+)
Acid-base deficit: 10 mmol/L — ABNORMAL HIGH (ref 0.0–2.0)
BICARBONATE: 17.8 mmol/L — AB (ref 20.0–28.0)
O2 Saturation: 91 %
PCO2 ART: 43.5 mmHg (ref 32.0–48.0)
PO2 ART: 73 mmHg — AB (ref 83.0–108.0)
TCO2: 19 mmol/L — ABNORMAL LOW (ref 22–32)
pH, Arterial: 7.22 — ABNORMAL LOW (ref 7.350–7.450)

## 2018-08-05 LAB — URINALYSIS, ROUTINE W REFLEX MICROSCOPIC
BILIRUBIN URINE: NEGATIVE
GLUCOSE, UA: NEGATIVE mg/dL
Ketones, ur: NEGATIVE mg/dL
NITRITE: NEGATIVE
PH: 5 (ref 5.0–8.0)
Protein, ur: NEGATIVE mg/dL
RBC / HPF: >50 RBC/hpf — ABNORMAL HIGH (ref 0–5)
SPECIFIC GRAVITY, URINE: 1.008 (ref 1.005–1.030)

## 2018-08-05 LAB — MRSA PCR SCREENING: MRSA BY PCR: NEGATIVE

## 2018-08-05 LAB — RENAL FUNCTION PANEL
ALBUMIN: 3.1 g/dL — AB (ref 3.5–5.0)
ANION GAP: 12 (ref 5–15)
BUN: 55 mg/dL — AB (ref 8–23)
CO2: 22 mmol/L (ref 22–32)
Calcium: 8.4 mg/dL — ABNORMAL LOW (ref 8.9–10.3)
Chloride: 102 mmol/L (ref 98–111)
Creatinine, Ser: 3.19 mg/dL — ABNORMAL HIGH (ref 0.61–1.24)
GFR calc Af Amer: 19 mL/min — ABNORMAL LOW (ref >60)
GFR, EST NON AFRICAN AMERICAN: 17 mL/min — AB (ref >60)
Glucose, Bld: 156 mg/dL — ABNORMAL HIGH (ref 70–99)
PHOSPHORUS: 3.7 mg/dL (ref 2.5–4.6)
POTASSIUM: 3.8 mmol/L (ref 3.5–5.1)
Sodium: 136 mmol/L (ref 135–145)

## 2018-08-05 LAB — CK: Total CK: 25 U/L — ABNORMAL LOW (ref 49–397)

## 2018-08-05 LAB — GLUCOSE, CAPILLARY
GLUCOSE-CAPILLARY: 170 mg/dL — AB (ref 70–99)
Glucose-Capillary: 222 mg/dL — ABNORMAL HIGH (ref 70–99)
Glucose-Capillary: 188 mg/dL — ABNORMAL HIGH (ref 70–99)

## 2018-08-05 LAB — POCT ACTIVATED CLOTTING TIME
ACTIVATED CLOTTING TIME: 136 s
Activated Clotting Time: 0 seconds
Activated Clotting Time: 114 seconds
Activated Clotting Time: 175 seconds

## 2018-08-05 LAB — LACTIC ACID, PLASMA: LACTIC ACID, VENOUS: 2.8 mmol/L — AB (ref 0.5–1.9)

## 2018-08-05 LAB — APTT: aPTT: 28 seconds (ref 24–36)

## 2018-08-05 MED ORDER — ASPIRIN 81 MG PO CHEW
324.0000 mg | CHEWABLE_TABLET | ORAL | Status: AC
  Administered 2018-08-05: 324 mg via ORAL
  Filled 2018-08-05: qty 4

## 2018-08-05 MED ORDER — IPRATROPIUM-ALBUTEROL 0.5-2.5 (3) MG/3ML IN SOLN
3.0000 mL | Freq: Four times a day (QID) | RESPIRATORY_TRACT | Status: DC
  Administered 2018-08-05 – 2018-08-07 (×7): 3 mL via RESPIRATORY_TRACT
  Filled 2018-08-05 (×6): qty 3

## 2018-08-05 MED ORDER — FENTANYL CITRATE (PF) 100 MCG/2ML IJ SOLN
25.0000 ug | Freq: Once | INTRAMUSCULAR | Status: AC
  Administered 2018-08-05: 25 ug via INTRAVENOUS

## 2018-08-05 MED ORDER — SODIUM CHLORIDE 0.9 % FOR CRRT
INTRAVENOUS_CENTRAL | Status: DC | PRN
  Filled 2018-08-05: qty 1000

## 2018-08-05 MED ORDER — PRISMASOL BGK 0/2.5 32-2.5 MEQ/L IV SOLN: INTRAVENOUS | Status: DC

## 2018-08-05 MED ORDER — SODIUM CHLORIDE 0.9 % IJ SOLN
250.0000 [IU]/h | INTRAMUSCULAR | Status: DC
  Administered 2018-08-05: 250 [IU]/h via INTRAVENOUS_CENTRAL
  Administered 2018-08-06: 1000 [IU]/h via INTRAVENOUS_CENTRAL
  Filled 2018-08-05 (×5): qty 2

## 2018-08-05 MED ORDER — HEPARIN BOLUS VIA INFUSION (CRRT)
1000.0000 [IU] | INTRAVENOUS | Status: DC | PRN
  Filled 2018-08-05: qty 1000

## 2018-08-05 MED ORDER — SODIUM BICARBONATE 8.4 % IV SOLN
INTRAVENOUS | Status: AC
  Administered 2018-08-05: 100 meq via INTRAVENOUS
  Filled 2018-08-05: qty 100

## 2018-08-05 MED ORDER — PRISMASOL BGK 0/2.5 32-2.5 MEQ/L IV SOLN
INTRAVENOUS | Status: DC
  Administered 2018-08-05: 15:00:00 via INTRAVENOUS_CENTRAL
  Filled 2018-08-05 (×9): qty 5000

## 2018-08-05 MED ORDER — FENTANYL CITRATE (PF) 100 MCG/2ML IJ SOLN
INTRAMUSCULAR | Status: AC
  Filled 2018-08-05: qty 2

## 2018-08-05 MED ORDER — ATROPINE SULFATE 1 MG/10ML IJ SOSY
PREFILLED_SYRINGE | INTRAMUSCULAR | Status: AC
  Filled 2018-08-05: qty 10

## 2018-08-05 MED ORDER — FUROSEMIDE 10 MG/ML IJ SOLN
80.0000 mg | Freq: Two times a day (BID) | INTRAMUSCULAR | Status: DC
  Administered 2018-08-05 – 2018-08-10 (×10): 80 mg via INTRAVENOUS
  Filled 2018-08-05 (×10): qty 8

## 2018-08-05 MED ORDER — PRISMASOL BGK 4/2.5 32-4-2.5 MEQ/L IV SOLN
INTRAVENOUS | Status: DC
  Administered 2018-08-05: 23:00:00 via INTRAVENOUS_CENTRAL
  Filled 2018-08-05 (×3): qty 5000

## 2018-08-05 MED ORDER — HEPARIN SODIUM (PORCINE) 5000 UNIT/ML IJ SOLN
5000.0000 [IU] | Freq: Three times a day (TID) | INTRAMUSCULAR | Status: DC
  Administered 2018-08-05 – 2018-08-09 (×13): 5000 [IU] via SUBCUTANEOUS
  Filled 2018-08-05 (×13): qty 1

## 2018-08-05 MED ORDER — PRISMASOL BGK 4/2.5 32-4-2.5 MEQ/L IV SOLN
INTRAVENOUS | Status: DC
  Administered 2018-08-05: 23:00:00 via INTRAVENOUS_CENTRAL
  Filled 2018-08-05 (×2): qty 5000

## 2018-08-05 MED ORDER — MIDAZOLAM HCL 2 MG/2ML IJ SOLN
INTRAMUSCULAR | Status: AC
  Filled 2018-08-05: qty 2

## 2018-08-05 MED ORDER — HEPARIN SODIUM (PORCINE) 1000 UNIT/ML DIALYSIS
1000.0000 [IU] | INTRAMUSCULAR | Status: DC | PRN
  Filled 2018-08-05: qty 6

## 2018-08-05 MED ORDER — SODIUM BICARBONATE 8.4 % IV SOLN
100.0000 meq | Freq: Once | INTRAVENOUS | Status: AC
  Administered 2018-08-05: 100 meq via INTRAVENOUS

## 2018-08-05 MED ORDER — SODIUM CHLORIDE 0.9 % IV SOLN
2.0000 g | INTRAVENOUS | Status: AC
  Administered 2018-08-05 – 2018-08-09 (×5): 2 g via INTRAVENOUS
  Filled 2018-08-05 (×5): qty 20

## 2018-08-05 MED ORDER — PRISMASOL BGK 4/2.5 32-4-2.5 MEQ/L IV SOLN
INTRAVENOUS | Status: DC
  Administered 2018-08-05 – 2018-08-06 (×5): via INTRAVENOUS_CENTRAL
  Filled 2018-08-05 (×16): qty 5000

## 2018-08-05 MED ORDER — HEPARIN SODIUM (PORCINE) 1000 UNIT/ML DIALYSIS
1000.0000 [IU] | INTRAMUSCULAR | Status: DC | PRN
  Administered 2018-08-06: 2400 [IU] via INTRAVENOUS_CENTRAL
  Filled 2018-08-05 (×3): qty 6

## 2018-08-05 MED ORDER — IPRATROPIUM-ALBUTEROL 0.5-2.5 (3) MG/3ML IN SOLN
RESPIRATORY_TRACT | Status: AC
  Filled 2018-08-05: qty 3

## 2018-08-05 MED ORDER — PRISMASOL BGK 0/2.5 32-2.5 MEQ/L IV SOLN
INTRAVENOUS | Status: DC
  Administered 2018-08-05: 15:00:00 via INTRAVENOUS_CENTRAL
  Filled 2018-08-05 (×2): qty 5000

## 2018-08-05 MED ORDER — EPINEPHRINE PF 1 MG/10ML IJ SOSY
PREFILLED_SYRINGE | INTRAMUSCULAR | Status: AC
  Filled 2018-08-05: qty 10

## 2018-08-05 MED ORDER — PRISMASOL BGK 0/2.5 32-2.5 MEQ/L IV SOLN
INTRAVENOUS | Status: DC
  Administered 2018-08-05: 15:00:00 via INTRAVENOUS_CENTRAL
  Filled 2018-08-05: qty 5000

## 2018-08-05 MED ORDER — ASPIRIN 300 MG RE SUPP: 300.0000 mg | RECTAL | Status: AC

## 2018-08-05 NOTE — Procedures (Signed)
Hemodialysis Catheter Insertion Procedure Note [facility]ondrick [facility]reira [facility] [id number] 1933-05-28  Procedure: Insertion of Hemodialysis Catheter Indications: Hemodialysis  Procedure Details Consent: Unable to obtain consent because of emergent medical necessity.  Time Out: Verified patient identification, verified procedure, site/side was marked, verified correct patient position, special equipment/implants available, medications/allergies/relevent history reviewed, required imaging and test results available.  Performed  Maximum sterile technique was used including antiseptics, cap, gloves, gown, hand hygiene, mask and sheet.  Skin prep: Chlorhexidine; local anesthetic administered  A Trialysis HD catheter was placed in the right internal jugular vein using the Seldinger technique.  Evaluation Blood flow good Complications: No apparent complications Patient did tolerate procedure well. Chest X-ray ordered to verify placement.  CXR: pending.   Procedure performed with ultrasound guidance for real time vessel cannulation.     Jovita Kussmaul, AG-ACNP [facility]  Pgr: [phone]  PCCM Pgr: [phone]

## 2018-08-05 NOTE — Consult Note (Signed)
Referring Provider: No ref. provider found Primary Care Physician:  Richardean Chimera, MD Primary Nephrologist:     Reason for Consultation: Acute kidney injury, hyperkalemia, severe metabolic acidosis, encephalopathy.  HPI: This is a 84 year old Caucasian male with a history of obesity hypertension diabetes and hyperlipidemia that has been using his usual state of health until about 2 weeks ago.  His son had noticed that he did become increasingly unsteady and had fallen on several occasions the latest of which occurred at a restaurant which precipitated him being brought to the emergency room.  His blood work showed that he had acute kidney injury with hyperkalemia.  His outpatient medications include losartan 100 mg daily.  Do not have any historical labs. In the hospital he is awake alert and oriented.  His potassium is elevated at 7.0 and creatinine elevated at 5.21 his glucose is 217 and CO2 19 his liver enzymes are within normal range his white count is 16.3 and his hemoglobin 11.6. Chest x-ray reveals consolidation consistent with left lower lobe pneumonia. He is afebrile with a temperature 97.8 blood pressure 135/59 pulse of 40.   Past Medical History:  Diagnosis Date  . Hypertension   . Peripheral neuropathy     Past Surgical History:  Procedure Laterality Date  . ESOPHAGOGASTRODUODENOSCOPY N/A 03/23/2013   Procedure: ESOPHAGOGASTRODUODENOSCOPY (EGD);  Surgeon: Malissa Hippo, MD;  Location: [facility]ITE;  Service: Endoscopy;  Laterality: N/A;  1120  . TONSILLECTOMY      Prior to Admission medications   Medication Sig Start Date End Date Taking? Authorizing Provider  ALPRAZolam (XANAX) 1 MG tablet Take 1 mg by mouth 2 (two) times daily before a meal.   Yes [provider]  amitriptyline (ELAVIL) 50 MG tablet Take 50 mg by mouth at bedtime.   Yes [provider]  aspirin 81 MG tablet Take 81 mg by mouth daily.   Yes [provider]  fish oil-omega-3  fatty acids 1000 MG capsule Take by mouth 2 (two) times daily before a meal.   Yes [provider]  FLUoxetine (PROZAC) 20 MG capsule Take 20 mg by mouth daily.   Yes [provider]  gabapentin (NEURONTIN) 300 MG capsule Take 300 mg by mouth 3 (three) times daily.    Yes [provider]  losartan (COZAAR) 100 MG tablet Take 100 mg by mouth daily.   Yes [provider]  omeprazole (PRILOSEC) 20 MG capsule Take 20 mg by mouth 2 (two) times daily before a meal.   Yes [provider]  traMADol (ULTRAM) 50 MG tablet Take 100 mg by mouth 4 (four) times daily. 05/10/18  Yes [provider]  levofloxacin (LEVAQUIN) 500 MG tablet Take 1 tablet (500 mg total) by mouth daily. Patient not taking: Reported on 08/05/2018 03/23/13   Malissa Hippo, MD  vitamin B-12 (CYANOCOBALAMIN) 1000 MCG tablet Take 1,000 mcg by mouth daily.    [provider]    Current Facility-Administered Medications  Medication Dose Route Frequency Provider Last Rate Last Dose  . cefTRIAXone (ROCEPHIN) 2 g in sodium chloride 0.9 % 100 mL IVPB  2 g Intravenous Q24H Ileana Roup, MD 200 mL/hr at 08/05/18 1500    . fentaNYL (SUBLIMAZE) 100 MCG/2ML injection           . heparin injection 1,000-6,000 Units  1,000-6,000 Units CRRT PRN Elvis Coil, MD      . heparin injection 5,000 Units  5,000 Units Subcutaneous Q8H Gillermina Phy M,  MD   5,000 Units at 08/05/18 1449  . midazolam (VERSED) 2 MG/2ML injection           . prismasol BGK 0/2.5 5,000 mL dialysis replacement fluid   CRRT Continuous Elvis Coil, MD 200 mL/hr at 08/05/18 1437    . prismasol BGK 0/2.5 5,000 mL dialysis replacement fluid   CRRT Continuous Elvis Coil, MD 300 mL/hr at 08/05/18 1440    . prismasol BGK 0/2.5 5,000 mL dialysis solution   CRRT Continuous Elvis Coil, MD 2,000 mL/hr at 08/05/18 1439    . sodium chloride 0.9 % primer fluid for CRRT   CRRT PRN Elvis Coil, MD        Allergies as of  08/05/2018  . (No Known Allergies)    No family history on file.  Social History   Socioeconomic History  . Marital status: Single    Spouse name: Not on file  . Number of children: Not on file  . Years of education: Not on file  . Highest education level: Not on file  Occupational History  . Not on file  Social Needs  . Financial resource strain: Not on file  . Food insecurity:    Worry: Not on file    Inability: Not on file  . Transportation needs:    Medical: Not on file    Non-medical: Not on file  Tobacco Use  . Smoking status: Former Games developer  . Tobacco comment: quit smoking 10 yrs ago x 50 109yrs  Substance and Sexual Activity  . Alcohol use: No  . Drug use: No  . Sexual activity: Not on file  Lifestyle  . Physical activity:    Days per week: Not on file    Minutes per session: Not on file  . Stress: Not on file  Relationships  . Social connections:    Talks on phone: Not on file    Gets together: Not on file    Attends religious service: Not on file    Active member of club or organization: Not on file    Attends meetings of clubs or organizations: Not on file    Relationship status: Not on file  . Intimate partner violence:    Fear of current or ex partner: Not on file    Emotionally abused: Not on file    Physically abused: Not on file    Forced sexual activity: Not on file  Other Topics Concern  . Not on file  Social History Narrative  . Not on file    Review of Systems:   Physical Exam: Vital signs in last 24 hours: Temp:  [97.8 F (36.6 C)-97.9 F (36.6 C)] 97.8 F (36.6 C) (09/28 1157) Pulse Rate:  [40-76] 76 (09/28 1500) Resp:  [16-23] 23 (09/28 1500) BP: (118-145)/(54-107) 118/107 (09/28 1500) SpO2:  [92 %-97 %] 96 % (09/28 1500) Weight:  [101 kg] 101 kg (09/28 1115)   General:   Ill-appearing not confused awake alert and oriented Head:  Normocephalic and atraumatic. Eyes:  Sclera clear, no icterus.   Conjunctiva pink. Ears:  Normal  auditory acuity. Nose:  No deformity, discharge,  or lesions. Mouth:  No deformity or lesions, dentition normal. Neck:  Supple; no masses or thyromegaly. JVP not elevated Lungs: Diminished breath sounds 4 L nasal cannula. Heart:  Regular rate and rhythm; no murmurs, clicks, rubs,  or gallops. Abdomen:  Soft, nontender and nondistended. No masses, hepatosplenomegaly or hernias noted. Normal bowel sounds, without guarding, and without rebound.  Msk:  Symmetrical without gross deformities. Normal posture. Pulses:  No carotid, renal, femoral bruits. DP and PT symmetrical and equal Extremities:  Without clubbing or edema.  Right femoral catheter Neurologic:  Alert and  oriented x4;  grossly normal neurologically. Skin:  Intact without significant lesions or rashes.   Intake/Output from previous day: No intake/output data recorded. Intake/Output this shift: Total I/O In: 11.6 [IV Piggyback:11.6] Out: -   Lab Results: Recent Labs    08/05/18 1257  WBC 16.3*  HGB 11.6*  HCT 39.1  PLT 189   BMET Recent Labs    08/05/18 1257  NA 135  K 7.0*  CL 104  CO2 19*  GLUCOSE 217*  BUN 84*  CREATININE 5.21*  CALCIUM 9.4   LFT Recent Labs    08/05/18 1257  PROT 5.8*  ALBUMIN 3.0*  AST 21  ALT 22  ALKPHOS 60  BILITOT 0.6   PT/INR Recent Labs    08/05/18 1257  LABPROT 15.1  INR 1.20   Hepatitis Panel No results for input(s): HEPBSAG, HCVAB, HEPAIGM, HEPBIGM in the last 72 hours.  Studies/Results: Dg Chest Port 1 View  Result Date: 08/05/2018 CLINICAL DATA:  Shortness of breath EXAM: PORTABLE CHEST 1 VIEW COMPARISON:  August 04, 2018 FINDINGS: There is airspace consolidation in the left base with small left pleural effusion. Lungs elsewhere are clear. Heart is mildly enlarged with pulmonary vascularity normal. No adenopathy. There is aortic atherosclerosis. No evident bone lesions. IMPRESSION: Airspace consolidation left base consistent with pneumonia. Small left  pleural effusion. Lungs elsewhere clear. Stable cardiomegaly. There is aortic atherosclerosis. Aortic Atherosclerosis (ICD10-I70.0). Electronically Signed   By: Bretta Bang III M.D.   On: 08/05/2018 14:13    Assessment/Plan:  Acute kidney injury with unclear baseline.  History of hypertension taking ARB.  Admitted with hyperkalemia metabolic acidosis and acute renal failure.  Will check renal ultrasound.  CPK seems reasonable.  He may have an acute pneumonia, however other infections should be ruled out.  He does not appear to be in shock.  Review of medications revealed use of ARB but no other agents that could be responsible for his acute renal failure.  That did not seem to be any use of nonsteroidal inflammatory drugs.  We will check urine sediment and evaluate for hematuria .he does have a Foley catheter in place.  Mechanical fall evidence of injury.  Would probably benefit from head CT.  If not already done in previous hospital.  Would defer to his admitting physician.  Hyperkalemia will dialyze against a 0 potassium bath will need to check another renal panel in 3 to 4 hours.  Metabolic acidosis should correct with initiation of dialysis  Congestive heart failure with check 2D echo and initiate IV diuretics  Possible pneumonia being treated with Rocephin 2 g daily   LOS: 0 Garnetta Buddy @TODAY @3 :59 PM

## 2018-08-05 NOTE — Progress Notes (Signed)
CRITICAL VALUE ALERT  Critical Value:  Lactic Acid 2.8, K=2.8  Date & Time Notied:  08/05/18 1355  Provider Notified: Dr. Catha Gosselin  Orders Received/Actions taken: No new orders

## 2018-08-05 NOTE — Procedures (Signed)
Name:  [name] MRN:  [id number] DOB:  September 26, 1933  PROCEDURE NOTE  Procedure: Dialysis catheter for hemodialysis emergent hyperkalemia as the indication  Indications:  Need for intravenous access and hemodynamic monitoring.  Severe hyperkalemia acute kidney injury emergency dialysis  Consent:  Consent was implied due to the emergency nature of the procedure.  Anesthesia:  A total of 10 mL of 1% Lidocaine was used for local infiltration anesthesia.  Procedure summary:  Appropriate equipment was assembled.  The patient was identified as Isaiah Brady and safety timeout was performed. The patient was placed in Trendelenburg position.  Sterile technique was used. The patient's left femoral area wall was prepped using chlorhexidine / alcohol scrub and the field was draped in usual sterile fashion with full body drape. After the adequate anesthesia was achieved, the left femoral vein was cannulated with the introducer needle without difficulty. A guide wire was advanced through the introducer needle, which was then withdrawn. A small skin incision was made at the point of wire entry, the dilator was inserted over the guide wire and appropriate dilation was obtained. The dilator was removed  triple-lumen catheter was advanced over the guide wire, which was then removed.  All ports were aspirated and flushed with normal saline without difficulty. The catheter was secured into place at 19 cm. Antibiotic patch was placed and sterile dressing was applied. Post-procedure chest x-ray was ordered.  Complications:  No immediate complications were noted.  Hemodynamic parameters and oxygenation remained stable throughout the procedure.  Estimated blood loss:  Less then 5 mL.  Ileana Roup, MD Pulmonary and Critical Care Medicine [facility] Cell: [phone]  08/05/2018, 1:26 PM

## 2018-08-05 NOTE — H&P (Signed)
Name: Isaiah Brady MRN: [id number] DOB: [birth year]    ADMISSION DATE:  08/05/2018 CONSULTATION DATE: July 28, 2018  REFERRING MD : Rocking [facility]  CHIEF COMPLAINT: Falls shortness of breath  BRIEF PATIENT DESCRIPTION: Patient is 35 with acute renal failure and severe hyperkalemia    HISTORY OF PRESENT ILLNESS: Patient is a 84 year old Caucasian male past medical history of obesity diabetes hypertension hyperlipidemia lives alone history was obtained by his son as the patient is not able to provide any history patient is his usual state of health he had physically week or 2 weeks ago son is not sure if the patient had a blood test done with that but he is reassured that he had physical and he was reassured he has been falling yesterday he went to have dinner at a restaurant he had fallen today he yesterday the day before he had falling went to the other hospital [facility] and rocking him where he had blood work which showed severe hyperkalemia and acute kidney injury assuming that he had stable baseline creatinine patient is on losartan at home. Patient has received a shifting treatment couple of times with calcium gluconate and 1 dose of Kayexalate p.o.  Patient is not able to provide any history he is very restless and and encephalopathic.  PAST MEDICAL HISTORY :   has a past medical history of Hypertension and Peripheral neuropathy.  has a past surgical history that includes Tonsillectomy and Esophagogastroduodenoscopy (N/A, 03/23/2013). Prior to Admission medications   Medication Sig Start Date End Date Taking? Authorizing Provider  ALPRAZolam (XANAX) 1 MG tablet Take 1 mg by mouth 2 (two) times daily before a meal.   Yes [provider]  amitriptyline (ELAVIL) 50 MG tablet Take 50 mg by mouth at bedtime.   Yes [provider]  aspirin 81 MG tablet Take 81 mg by mouth daily.   Yes [provider]  fish oil-omega-3 fatty acids 1000  MG capsule Take by mouth 2 (two) times daily before a meal.   Yes [provider]  FLUoxetine (PROZAC) 20 MG capsule Take 20 mg by mouth daily.   Yes [provider]  gabapentin (NEURONTIN) 300 MG capsule Take 300 mg by mouth 3 (three) times daily.    Yes [provider]  losartan (COZAAR) 100 MG tablet Take 100 mg by mouth daily.   Yes [provider]  omeprazole (PRILOSEC) 20 MG capsule Take 20 mg by mouth 2 (two) times daily before a meal.   Yes [provider]  traMADol (ULTRAM) 50 MG tablet Take 100 mg by mouth 4 (four) times daily. 05/10/18  Yes [provider]  levofloxacin (LEVAQUIN) 500 MG tablet Take 1 tablet (500 mg total) by mouth daily. Patient not taking: Reported on 08/05/2018 03/23/13   Malissa Hippo, MD  vitamin B-12 (CYANOCOBALAMIN) 1000 MCG tablet Take 1,000 mcg by mouth daily.    [provider]   No Known Allergies  FAMILY HISTORY:  family history is not on file. SOCIAL HISTORY:  reports that he has quit smoking. He does not have any smokeless tobacco history on file. He reports that he does not drink alcohol or use drugs.  REVIEW OF SYSTEMS:   Unable to obtain due to patient's conditions  SUBJECTIVE:   VITAL SIGNS: Temp:  [97.8 F (36.6 C)-97.9 F (36.6 C)] 97.8 F (36.6 C) (09/28 1157) Pulse Rate:  [45] 45 (09/28 1115) Resp:  [23] 23 (09/28 1115) BP: (133)/(88) 133/88 (  09/28 1115) SpO2:  [97 %] 97 % (09/28 1115) Weight:  [101 kg] 101 kg (09/28 1115)  PHYSICAL EXAMINATION: General: In moderate acute distress from shortness of breath Neuro:  WNL , in a state of delirium encephalopathy, EOMI , CN II-XII intact , UL , LL strength is symmetrical and 5/5 HEENT:  atraumatic , no jaundice , dry mucous membranes  Cardiovascular: Bradycardia regular Lungs:  CTA bilateral , no wheezing or crackles  Abdomen:  Soft lax +BS , no tenderness . Musculoskeletal:  WNL , normal pulses  Skin:  No rash    No  results for input(s): NA, K, CL, CO2, BUN, CREATININE, GLUCOSE in the last 168 hours. Recent Labs  Lab 08/05/18 1257  HGB 11.6*  HCT 39.1  WBC 16.3*  PLT 189   No results found.  ASSESSMENT / PLAN:  --Severe hyperkalemia  Unknown etiology most likely from acute kidney injury at this point symptomatic treatment with dialysis shifting and avoid ACE or ARB --Severe acute kidney injury  Unknown etiology most likely from losartan use and may be dehydration patient seems to be euvolemic at this point dialysis will start we will order renal ultrasound does not seem to be obstruction.  --Severe acidosis  Multifactorial waiting on lactic acid from uremia as well hopefully CRRT will help with the dialysis with acidosis might need bicarb bath  --Acute hypoxemic respiratory failure  Most likely from volume overload pulmonary edema waiting on a chest x-ray.  --Encephalopathy  Respiratory from uremia acidosis electrode disturbances with further chemistry check UA chest check chest x-ray to rule out infections.  We will start the patient on empiric ceftriaxone total all of the cultures come back.   Critical care time spent in the care of this patient is 45 minutes.  Pulmonary and Critical Care Medicine [facility] Pager: [phone]  08/05/2018, 1:34 PM

## 2018-08-05 NOTE — Progress Notes (Signed)
Contitined problem with trialysis catheter left groin : unable to draw back from return port : discussed with Dr Conseco and Pola Corn MD: Patient needs new trialysis access : per Dr Web stop heparin once new site obtained Heart rate 36. Provider aware  Will place pacred pads unattached :

## 2018-08-06 ENCOUNTER — Inpatient Hospital Stay (HOSPITAL_COMMUNITY): Payer: Medicare Other

## 2018-08-06 DIAGNOSIS — I361 Nonrheumatic tricuspid (valve) insufficiency: Secondary | ICD-10-CM

## 2018-08-06 LAB — ECHOCARDIOGRAM COMPLETE
Height: 70 in
WEIGHTICAEL: 3463.87 [oz_av]

## 2018-08-06 LAB — GLUCOSE, CAPILLARY
GLUCOSE-CAPILLARY: 139 mg/dL — AB (ref 70–99)
GLUCOSE-CAPILLARY: 141 mg/dL — AB (ref 70–99)
GLUCOSE-CAPILLARY: 144 mg/dL — AB (ref 70–99)
GLUCOSE-CAPILLARY: 95 mg/dL (ref 70–99)
Glucose-Capillary: 120 mg/dL — ABNORMAL HIGH (ref 70–99)
Glucose-Capillary: 107 mg/dL — ABNORMAL HIGH (ref 70–99)

## 2018-08-06 LAB — RENAL FUNCTION PANEL
ANION GAP: 11 (ref 5–15)
Albumin: 3.1 g/dL — ABNORMAL LOW (ref 3.5–5.0)
Albumin: 2.8 g/dL — ABNORMAL LOW (ref 3.5–5.0)
Anion gap: 10 (ref 5–15)
BUN: 43 mg/dL — ABNORMAL HIGH (ref 8–23)
BUN: 34 mg/dL — AB (ref 8–23)
CALCIUM: 8.5 mg/dL — AB (ref 8.9–10.3)
CHLORIDE: 103 mmol/L (ref 98–111)
CO2: 27 mmol/L (ref 22–32)
CO2: 29 mmol/L (ref 22–32)
CREATININE: 2.34 mg/dL — AB (ref 0.61–1.24)
Calcium: 8 mg/dL — ABNORMAL LOW (ref 8.9–10.3)
Chloride: 99 mmol/L (ref 98–111)
Creatinine, Ser: 1.99 mg/dL — ABNORMAL HIGH (ref 0.61–1.24)
GFR calc Af Amer: 28 mL/min — ABNORMAL LOW (ref >60)
GFR calc Af Amer: 34 mL/min — ABNORMAL LOW (ref >60)
GFR calc non Af Amer: 24 mL/min — ABNORMAL LOW (ref >60)
GFR, EST NON AFRICAN AMERICAN: 29 mL/min — AB (ref >60)
GLUCOSE: 129 mg/dL — AB (ref 70–99)
GLUCOSE: 131 mg/dL — AB (ref 70–99)
PHOSPHORUS: 3.2 mg/dL (ref 2.5–4.6)
Phosphorus: 3.4 mg/dL (ref 2.5–4.6)
Potassium: 4.4 mmol/L (ref 3.5–5.1)
Potassium: 4.5 mmol/L (ref 3.5–5.1)
SODIUM: 141 mmol/L (ref 135–145)
SODIUM: 138 mmol/L (ref 135–145)

## 2018-08-06 LAB — POCT ACTIVATED CLOTTING TIME
ACTIVATED CLOTTING TIME: 175 s
ACTIVATED CLOTTING TIME: 186 s
ACTIVATED CLOTTING TIME: 169 s
ACTIVATED CLOTTING TIME: 191 s
Activated Clotting Time: 180 seconds
Activated Clotting Time: 180 seconds
Activated Clotting Time: 186 seconds
Activated Clotting Time: 191 seconds

## 2018-08-06 LAB — APTT: APTT: 33 s (ref 24–36)

## 2018-08-06 LAB — MAGNESIUM: Magnesium: 2.3 mg/dL (ref 1.7–2.4)

## 2018-08-06 NOTE — Progress Notes (Signed)
ACT orders discontinued per dr Hyman Hopes orders

## 2018-08-06 NOTE — Progress Notes (Signed)
Spoke with Dr. Marchelle Gearing about need for wrist restraints at night due to patient becoming confused and agitated during the night.  Erick Blinks, RN

## 2018-08-06 NOTE — Progress Notes (Signed)
  Echocardiogram 2D Echocardiogram has been performed.  [name] 08/06/2018, 1:29 PM

## 2018-08-06 NOTE — Progress Notes (Signed)
[facility] ROUNDING NOTE   Subjective:   Patient appears somewhat confused this morning.  He appears to be tolerating CRRT well.  Right IJ catheter placed as malfunction of right femoral catheter.  Blood pressure 104/62 pulse 87 temperature 95.9 O2 sats 97% 3 L nasal cannula.  Renal ultrasound revealed no evidence of hydronephrosis.  Chest x-ray 08/05/2018 showed cardiomegaly with atelectasis.  Urine output over 3 L.  Sodium 141 potassium 4.4 chloride 103 CO2 27 BUN 43 creatinine 2.3 calcium 8.5 phosphorus 3.4 albumin 3.1   Objective:  Vital signs in last 24 hours:  Temp:  [95.9 F (35.5 C)-97.8 F (36.6 C)] 95.9 F (35.5 C) (09/29 1138) Pulse Rate:  [40-110] 87 (09/29 1100) Resp:  [14-23] 17 (09/29 1100) BP: (95-176)/(39-118) 104/62 (09/29 1100) SpO2:  [92 %-100 %] 97 % (09/29 1100) Weight:  [98.2 kg] 98.2 kg (09/29 0500)  Weight change:  Filed Weights   08/05/18 1115 08/06/18 0500  Weight: 101 kg 98.2 kg    Intake/Output: I/O last 3 completed shifts: In: 11.6 [IV Piggyback:11.6] Out: 4701 [Urine:3750; Other:951]   Intake/Output this shift:  Total I/O In: -  Out: 794 [Urine:500; Other:294]  CVS- RRR no rubs gallops 3 out of 6 faint systolic murmur RS- CTA diminished at bases 3 l nasal cannula ABD- BS present soft non-distended EXT- no edema   Basic Metabolic Panel: Recent Labs  Lab 08/05/18 1257 08/05/18 2000 08/06/18 0430 08/06/18 0858  NA 135 136 141  --   K 7.0* 3.8 4.4  --   CL 104 102 103  --   CO2 19* 22 27  --   GLUCOSE 217* 156* 129*  --   BUN 84* 55* 43*  --   CREATININE 5.21* 3.19* 2.34*  --   CALCIUM 9.4 8.4* 8.5*  --   MG  --   --   --  2.3  PHOS  --  3.7 3.4  --     Liver Function Tests: Recent Labs  Lab 08/05/18 1257 08/05/18 2000 08/06/18 0430  AST 21  --   --   ALT 22  --   --   ALKPHOS 60  --   --   BILITOT 0.6  --   --   PROT 5.8*  --   --   ALBUMIN 3.0* 3.1* 3.1*   No results for input(s): LIPASE, AMYLASE  in the last 168 hours. No results for input(s): AMMONIA in the last 168 hours.  CBC: Recent Labs  Lab 08/05/18 1257  WBC 16.3*  NEUTROABS 14.0*  HGB 11.6*  HCT 39.1  MCV 100.8*  PLT 189    Cardiac Enzymes: Recent Labs  Lab 08/05/18 1534  CKTOTAL 25*    BNP: Invalid input(s): POCBNP  CBG: Recent Labs  Lab 08/05/18 1939 08/06/18 0021 08/06/18 0410 08/06/18 0758 08/06/18 1131  GLUCAP 170* 139* 120* 107* 141*    Microbiology: Results for orders placed or performed during the hospital encounter of 08/05/18  MRSA PCR Screening     Status: None   Collection Time: 08/05/18 11:33 AM  Result Value Ref Range Status   MRSA by PCR NEGATIVE NEGATIVE Final    Comment:        The GeneXpert MRSA Assay (FDA approved for NASAL specimens only), is one component of a comprehensive MRSA colonization surveillance program. It is not intended to diagnose MRSA infection nor to guide or monitor treatment for MRSA infections. Performed at [facility], [address]  [place], [place] [address]     Coagulation Studies: Recent Labs    08/05/18 1257  LABPROT 15.1  INR 1.20    Urinalysis: Recent Labs    08/05/18 2003  COLORURINE YELLOW  LABSPEC 1.008  PHURINE 5.0  GLUCOSEU NEGATIVE  HGBUR MODERATE*  BILIRUBINUR NEGATIVE  KETONESUR NEGATIVE  PROTEINUR NEGATIVE  NITRITE NEGATIVE  LEUKOCYTESUR TRACE*      Imaging: US Renal  Result Date: 08/06/2018 CLINICAL DATA:  Acute kidney injury EXAM: RENAL / URINARY TRACT ULTRASOUND COMPLETE COMPARISON:  None. FINDINGS: Right Kidney: Length: 12.9 cm. Echogenic renal parenchyma. Cortical thinning. No mass or hydronephrosis. Left Kidney: Length: 10.8 cm. Echogenic renal parenchyma. Cortical thinning/atrophy. No mass or hydronephrosis. Bladder: Not discretely visualized/underdistended. IMPRESSION: Bilateral renal cortical thinning/atrophy, left greater than right. Echogenic renal parenchyma, suggesting medical renal disease.  No hydronephrosis. Bladder is not discretely visualized/underdistended. Electronically Signed   By: Charline Bills M.D.   On: 08/06/2018 01:59   Dg Chest Port 1 View  Result Date: 08/05/2018 CLINICAL DATA:  Central venous catheter placement EXAM: PORTABLE CHEST 1 VIEW COMPARISON:  08/05/2018, 08/04/2018 FINDINGS: Placement of right-sided central venous catheter with tip superimposing the jugular brachiocephalic confluence. No right pneumothorax. Continued cardiomegaly with streaky atelectasis or infiltrate at the left base, slightly improved. IMPRESSION: 1. Right central venous catheter tip overlies the jugular/brachiocephalic confluence. There is no right pneumothorax 2. Cardiomegaly. Streaky atelectasis or infiltrate at the left base with some improvement in aeration. Electronically Signed   By: Jasmine Pang M.D.   On: 08/05/2018 21:53   Dg Chest Port 1 View  Result Date: 08/05/2018 CLINICAL DATA:  Shortness of breath EXAM: PORTABLE CHEST 1 VIEW COMPARISON:  August 04, 2018 FINDINGS: There is airspace consolidation in the left base with small left pleural effusion. Lungs elsewhere are clear. Heart is mildly enlarged with pulmonary vascularity normal. No adenopathy. There is aortic atherosclerosis. No evident bone lesions. IMPRESSION: Airspace consolidation left base consistent with pneumonia. Small left pleural effusion. Lungs elsewhere clear. Stable cardiomegaly. There is aortic atherosclerosis. Aortic Atherosclerosis (ICD10-I70.0). Electronically Signed   By: Bretta Bang III M.D.   On: 08/05/2018 14:13     Medications:   . cefTRIAXone (ROCEPHIN)  IV 200 mL/hr at 08/05/18 1500  . dialysis replacement fluid (prismasate) 200 mL/hr at 08/05/18 2324  . dialysis replacement fluid (prismasate) 300 mL/hr at 08/05/18 2322  . dialysate (PRISMASATE) 2,000 mL/hr at 08/06/18 0957  . sodium chloride    . sodium chloride     . furosemide  80 mg Intravenous Q12H  . heparin  5,000 Units  Subcutaneous Q8H  . ipratropium-albuterol  3 mL Nebulization Q6H   heparin, heparin, sodium chloride, sodium chloride  Assessment/ Plan:   Acute kidney injury unclear baseline was admitted with hyperkalemic metabolic acidosis and acute renal failure renal ultrasound did not reveal any hydronephrosis but relatively normal renal sizes appear to be a little echogenic.  CK level 25 urinalysis revealed greater than 50 RBCs 11-20 W BCs with Foley catheter possibly related to Foley trauma.  Patient was taking ARB's as outpatient.  Urine output appears to be improving I would discontinue CRRT at this time  Hyperkalemia resolved with CRRT  Metabolic acidosis resolved with CRRT  Hypervolemia have treated patients with with IV Lasix 80 mg twice daily  Congestive heart failure 2D echo pending  Possible pneumonia empiric Rocephin  Altered mental status thought to be metabolic however there is a history of fall and patient is to have a head CT  LOS: 1 Garnetta Buddy @TODAY @11 :52 AM

## 2018-08-06 NOTE — Progress Notes (Signed)
NAME:  Isaiah Brady, MRN:  [id number], DOB:  Oct 06, 1933, LOS: 1 ADMISSION DATE:  08/05/2018, CONSULTATION DATE:  9/28 REFERRING MD:  EDP, CHIEF COMPLAINT:  SOB   Brief History   84 year old male with acute renal failure and severe hyperkalemia admitted 9/28 for emergent dialysis  Past Medical History  Diabetes, hypertension, hyperlipidemia, obesity Significant Hospital Events   Emergent dialysis 9/28  Consults: date of consult/date signed off & final recs:  Renal (webb) 9/28 >>  Procedures (surgical and bedside):  Left femoral HD cath 9/28 > >9/29 (not working for HD) Right IJ HD cath 9/28 >>  Significant Diagnostic Tests:  Renal u/s 9/28>>>Bilateral renal cortical thinning/atrophy, left greater than right. Echogenic renal parenchyma, suggesting medical renal disease.  No hydronephrosis.  Bladder is not discretely visualized/underdistended. CT head 9/29>>>  Micro Data:    Antimicrobials:  Rocephin 9/28 >>  Subjective:  Remains on CRRT.  Potassium better.  Patient slightly confused but no complaints.  Objective   Blood pressure (!) 129/39, pulse 85, temperature (!) 97.3 F (36.3 C), temperature source Oral, resp. rate 19, height 5\' 10"  (1.778 m), weight 98.2 kg, SpO2 98 %.        Intake/Output Summary (Last 24 hours) at 08/06/2018 1054 Last data filed at 08/06/2018 1000 Gross per 24 hour  Intake 11.58 ml  Output 5098 ml  Net -5086.42 ml   Filed Weights   08/05/18 1115 08/06/18 0500  Weight: 101 kg 98.2 kg    Examination: General: Likely ill-appearing male, no acute distress on CRRT HENT: MM moist, right IJ HD catheter intact, Lungs: Respirations are even and nonlabored on nasal cannula Cardiovascular: S1-S2 RRR Abdomen: Round, soft Extremities: Warm and dry, no significant edema Neuro: Awake, intermittently confused, answers most questions, nonfocal  Resolved Hospital Problem list     Assessment & Plan:  Acute kidney failure - unclear etiology.   Does take ARB for hypertension but no other notable agents or NSAIDs.  No obstruction on renal ultrasound. Severe hyperkalemia Plan- Renal following Remains on CRRT- according to RN renal plans to discontinue CRRT later today Follow-up chemistry Hold ARB   Acute hypoxemic respiratory failure-in the setting of volume overload and CAP. Improved. ?CAP Plan- Supplemental O2 as needed Diuresis per renal Follow-up chest x-ray Empiric antibiotics as above Pulmonary hygiene  Altered mental status-likely metabolic encephalopathy from uremia but did have a fall at home. Plan- We will check head CT once CRRT is off   Disposition / Summary of Today's Plan 08/06/18   Keep in ICU.  Checking head CT.  CRRT per renal.    Diet: N.p.o. for now Pain/Anxiety/Delirium protocol (if indicated): N/A VAP protocol (if indicated): N/A DVT prophylaxis: Subcu heparin GI prophylaxis: N/A Hyperglycemia protocol: N/A Mobility: Bedrest Code Status: Full Family Communication: Family at bedside 9/29 on NP rounds  Labs   CBC: Recent Labs  Lab 08/05/18 1257  WBC 16.3*  NEUTROABS 14.0*  HGB 11.6*  HCT 39.1  MCV 100.8*  PLT 189    Basic Metabolic Panel: Recent Labs  Lab 08/05/18 1257 08/05/18 2000 08/06/18 0430 08/06/18 0858  NA 135 136 141  --   K 7.0* 3.8 4.4  --   CL 104 102 103  --   CO2 19* 22 27  --   GLUCOSE 217* 156* 129*  --   BUN 84* 55* 43*  --   CREATININE 5.21* 3.19* 2.34*  --   CALCIUM 9.4 8.4* 8.5*  --   MG  --   --   --  2.3  PHOS  --  3.7 3.4  --    GFR: Estimated Creatinine Clearance: 28.1 mL/min (A) (by C-G formula based on SCr of 2.34 mg/dL (H)). Recent Labs  Lab 08/05/18 1257  WBC 16.3*  LATICACIDVEN 2.8*    Liver Function Tests: Recent Labs  Lab 08/05/18 1257 08/05/18 2000 08/06/18 0430  AST 21  --   --   ALT 22  --   --   ALKPHOS 60  --   --   BILITOT 0.6  --   --   PROT 5.8*  --   --   ALBUMIN 3.0* 3.1* 3.1*   No results for input(s): LIPASE,  AMYLASE in the last 168 hours. No results for input(s): AMMONIA in the last 168 hours.  ABG    Component Value Date/Time   PHART 7.220 (L) 08/05/2018 1331   PCO2ART 43.5 08/05/2018 1331   PO2ART 73.0 (L) 08/05/2018 1331   HCO3 17.8 (L) 08/05/2018 1331   TCO2 19 (L) 08/05/2018 1331   ACIDBASEDEF 10.0 (H) 08/05/2018 1331   O2SAT 91.0 08/05/2018 1331     Coagulation Profile: Recent Labs  Lab 08/05/18 1257  INR 1.20    Cardiac Enzymes: Recent Labs  Lab 08/05/18 1534  CKTOTAL 25*    HbA1C: No results found for: HGBA1C  CBG: Recent Labs  Lab 08/05/18 1633 08/05/18 1939 08/06/18 0021 08/06/18 0410 08/06/18 0758  GLUCAP 188* 170* 139* 120* 107*    Katy Obie Silos, NP 08/06/2018  10:54 AM Pager: [phone] or [phone]

## 2018-08-07 ENCOUNTER — Encounter (HOSPITAL_COMMUNITY): Payer: Self-pay | Admitting: Cardiology

## 2018-08-07 ENCOUNTER — Inpatient Hospital Stay (HOSPITAL_COMMUNITY): Payer: Medicare Other

## 2018-08-07 ENCOUNTER — Other Ambulatory Visit: Payer: Self-pay

## 2018-08-07 DIAGNOSIS — I480 Paroxysmal atrial fibrillation: Secondary | ICD-10-CM

## 2018-08-07 LAB — MAGNESIUM: Magnesium: 1.9 mg/dL (ref 1.7–2.4)

## 2018-08-07 LAB — RENAL FUNCTION PANEL
ANION GAP: 9 (ref 5–15)
Albumin: 2.9 g/dL — ABNORMAL LOW (ref 3.5–5.0)
BUN: 33 mg/dL — ABNORMAL HIGH (ref 8–23)
CO2: 31 mmol/L (ref 22–32)
Calcium: 8.2 mg/dL — ABNORMAL LOW (ref 8.9–10.3)
Chloride: 101 mmol/L (ref 98–111)
Creatinine, Ser: 1.79 mg/dL — ABNORMAL HIGH (ref 0.61–1.24)
GFR calc non Af Amer: 33 mL/min — ABNORMAL LOW (ref >60)
GFR, EST AFRICAN AMERICAN: 39 mL/min — AB (ref >60)
Glucose, Bld: 101 mg/dL — ABNORMAL HIGH (ref 70–99)
POTASSIUM: 3.7 mmol/L (ref 3.5–5.1)
Phosphorus: 3 mg/dL (ref 2.5–4.6)
Sodium: 141 mmol/L (ref 135–145)

## 2018-08-07 LAB — GLUCOSE, CAPILLARY
GLUCOSE-CAPILLARY: 215 mg/dL — AB (ref 70–99)
GLUCOSE-CAPILLARY: 130 mg/dL — AB (ref 70–99)
GLUCOSE-CAPILLARY: 125 mg/dL — AB (ref 70–99)
Glucose-Capillary: 96 mg/dL (ref 70–99)

## 2018-08-07 LAB — CBC
HCT: 39.8 % (ref 39.0–52.0)
Hemoglobin: 12.1 g/dL — ABNORMAL LOW (ref 13.0–17.0)
MCH: 29.4 pg (ref 26.0–34.0)
MCHC: 30.4 g/dL (ref 30.0–36.0)
MCV: 96.8 fL (ref 78.0–100.0)
Platelets: 164 10*3/uL (ref 150–400)
RBC: 4.11 MIL/uL — ABNORMAL LOW (ref 4.22–5.81)
RDW: 14.8 % (ref 11.5–15.5)
WBC: 14.1 10*3/uL — ABNORMAL HIGH (ref 4.0–10.5)

## 2018-08-07 LAB — APTT: APTT: 32 s (ref 24–36)

## 2018-08-07 LAB — PHOSPHORUS: Phosphorus: 3 mg/dL (ref 2.5–4.6)

## 2018-08-07 MED ORDER — METOPROLOL TARTRATE 25 MG PO TABS
25.0000 mg | ORAL_TABLET | Freq: Two times a day (BID) | ORAL | Status: DC
  Administered 2018-08-07 – 2018-08-09 (×4): 25 mg via ORAL
  Filled 2018-08-07 (×4): qty 1

## 2018-08-07 MED ORDER — CHLORHEXIDINE GLUCONATE CLOTH 2 % EX PADS
6.0000 | MEDICATED_PAD | Freq: Every day | CUTANEOUS | Status: DC
  Administered 2018-08-07 – 2018-08-08 (×2): 6 via TOPICAL

## 2018-08-07 MED ORDER — IPRATROPIUM BROMIDE 0.02 % IN SOLN
0.5000 mg | Freq: Three times a day (TID) | RESPIRATORY_TRACT | Status: DC
  Administered 2018-08-07 – 2018-08-08 (×3): 0.5 mg via RESPIRATORY_TRACT
  Filled 2018-08-07 (×2): qty 2.5

## 2018-08-07 MED ORDER — ORAL CARE MOUTH RINSE
15.0000 mL | Freq: Two times a day (BID) | OROMUCOSAL | Status: DC
  Administered 2018-08-07 – 2018-08-09 (×5): 15 mL via OROMUCOSAL

## 2018-08-07 MED ORDER — ASPIRIN 81 MG PO CHEW
81.0000 mg | CHEWABLE_TABLET | Freq: Every day | ORAL | Status: DC
  Administered 2018-08-07 – 2018-08-09 (×3): 81 mg via ORAL
  Filled 2018-08-07 (×3): qty 1

## 2018-08-07 MED ORDER — SODIUM CHLORIDE 0.9% FLUSH
10.0000 mL | Freq: Two times a day (BID) | INTRAVENOUS | Status: DC
  Administered 2018-08-07: 20 mL
  Administered 2018-08-08 – 2018-08-09 (×4): 10 mL

## 2018-08-07 MED ORDER — LEVALBUTEROL HCL 0.63 MG/3ML IN NEBU
0.6300 mg | INHALATION_SOLUTION | Freq: Three times a day (TID) | RESPIRATORY_TRACT | Status: DC
  Administered 2018-08-07 – 2018-08-08 (×3): 0.63 mg via RESPIRATORY_TRACT
  Filled 2018-08-07 (×2): qty 3

## 2018-08-07 MED ORDER — SODIUM CHLORIDE 0.9% FLUSH: 10.0000 mL | INTRAVENOUS | Status: DC | PRN

## 2018-08-07 MED ORDER — METOPROLOL TARTRATE 5 MG/5ML IV SOLN
2.5000 mg | INTRAVENOUS | Status: DC | PRN
  Administered 2018-08-07: 2.5 mg via INTRAVENOUS
  Administered 2018-08-08: 5 mg via INTRAVENOUS
  Filled 2018-08-07 (×2): qty 5

## 2018-08-07 NOTE — Progress Notes (Signed)
NAME:  Isaiah Brady, MRN:  [id number], DOB:  [birth year], LOS: 2 ADMISSION DATE:  08/05/2018, CONSULTATION DATE:  9/28 REFERRING MD:  EDP, CHIEF COMPLAINT:  SOB   Brief History   84 year old male with acute renal failure and severe hyperkalemia admitted 9/28 for emergent dialysis  Past Medical History  Diabetes, hypertension, hyperlipidemia, obesity Significant Hospital Events   Emergent dialysis 9/28 9/29 CRRT stopped and diuresis initiated. Responding well.   Consults: date of consult/date signed off & final recs:  Renal (webb) 9/28 >>  Procedures (surgical and bedside):  Left femoral HD cath 9/28 > >9/29 (not working for HD) Right IJ HD cath 9/28 >>  Significant Diagnostic Tests:  Renal u/s 9/28 >Bilateral renal cortical thinning/atrophy, left greater than right. Echogenic renal parenchyma, suggesting medical renal disease.  No hydronephrosis.  Bladder is not discretely visualized/underdistended. CT head 9/29 > Moderate to severe chronic small vessel ischemic changes. RIGHT basal ganglia lacunar infarct versus perivascular space.  Micro Data:    Antimicrobials:  Rocephin 9/28 >  Subjective:  No complaints this morning. Off CRRT since yesterday. Good UOP on Lasix  Objective   Blood pressure (!) 150/89, pulse (!) 27, temperature 97.7 F (36.5 C), temperature source Oral, resp. rate 20, height 5\' 10"  (1.778 m), weight 94.9 kg, SpO2 98 %.        Intake/Output Summary (Last 24 hours) at 08/07/2018 1233 Last data filed at 08/07/2018 1100 Gross per 24 hour  Intake 188.49 ml  Output 3700 ml  Net -3511.51 ml   Filed Weights   08/05/18 1115 08/06/18 0500 08/07/18 0324  Weight: 101 kg 98.2 kg 94.9 kg    Examination: General: Elderly male in NAD HENT: [place]/AT, PERRL, no appreciable JVD Lungs: Respirations are even and nonlabored on nasal cannula Cardiovascular: IRIR tachy Abdomen: Round, soft, non-tender.  Extremities: Warm and dry, no significant edema Neuro:  Awake, alert, oriented, non-focal.   Resolved Hospital Problem list     Assessment & Plan:  Acute kidney failure with history of CKD 3 - unclear etiology.  Does take ARB for hypertension but no other notable agents or NSAIDs.  No obstruction on renal ultrasound. Severe hyperkalemia -Nephrology following -Now off CRRT, diuresing well.  -Follow-up chemistry -Holding ARB  New onset atrial fibrillation currently with RVR - no history of this, although possible old stroke on CT head, so may have had this for some time. Son reports 6 months of unsteady gait.  - EKG now - metoprolol 5mg  PRN - resume ASA - Consult cardiology  - CHA2Ds2VASc score 3. Will defer anticoagulation to cardiology.   Acute hypoxemic respiratory failure-in the setting of volume overload and CAP. Improved. Suspect this is largely pulmonary edema. Cannot rule out CAP given leukocytosis. He has been afebrile Plan- Supplemental O2 as needed Diuresis per renal for volume removal.  Ceftriaxone for 5 day course. Low threshold to DC. Check PCT tomorrow.  Pulmonary hygiene  Altered mental status-Improved: likely metabolic encephalopathy from uremia but did have a fall at home. Evidence of possible old stroke on CT head.  Plan- Supportive care  Depression - holding alprazolam, amitriptyline, fluoxetine, tramdol, until mental status nearing baseline.    Disposition / Summary of Today's Plan 08/07/18   Transfer to tele. Rate control with metoprolol. Cardiology to see.     Diet: N.p.o. for now Pain/Anxiety/Delirium protocol (if indicated): N/A VAP protocol (if indicated): N/A DVT prophylaxis: Subcu heparin GI prophylaxis: N/A Hyperglycemia protocol: N/A Mobility: Bedrest Code Status: Full Family Communication: Family (  son) updated 9/30 at bedside.   Labs   CBC: Recent Labs  Lab 08/05/18 1257 08/07/18 0442  WBC 16.3* 14.1*  NEUTROABS 14.0*  --   HGB 11.6* 12.1*  HCT 39.1 39.8  MCV 100.8* 96.8  PLT 189 164     Basic Metabolic Panel: Recent Labs  Lab 08/05/18 1257 08/05/18 2000 08/06/18 0430 08/06/18 0858 08/06/18 1546 08/07/18 0442  NA 135 136 141  --  138 141  K 7.0* 3.8 4.4  --  4.5 3.7  CL 104 102 103  --  99 101  CO2 19* 22 27  --  29 31  GLUCOSE 217* 156* 129*  --  131* 101*  BUN 84* 55* 43*  --  34* 33*  CREATININE 5.21* 3.19* 2.34*  --  1.99* 1.79*  CALCIUM 9.4 8.4* 8.5*  --  8.0* 8.2*  MG  --   --   --  2.3  --  1.9  PHOS  --  3.7 3.4  --  3.2 3.0  3.0   GFR: Estimated Creatinine Clearance: 36.2 mL/min (A) (by C-G formula based on SCr of 1.79 mg/dL (H)). Recent Labs  Lab 08/05/18 1257 08/07/18 0442  WBC 16.3* 14.1*  LATICACIDVEN 2.8*  --     Liver Function Tests: Recent Labs  Lab 08/05/18 1257 08/05/18 2000 08/06/18 0430 08/06/18 1546 08/07/18 0442  AST 21  --   --   --   --   ALT 22  --   --   --   --   ALKPHOS 60  --   --   --   --   BILITOT 0.6  --   --   --   --   PROT 5.8*  --   --   --   --   ALBUMIN 3.0* 3.1* 3.1* 2.8* 2.9*   No results for input(s): LIPASE, AMYLASE in the last 168 hours. No results for input(s): AMMONIA in the last 168 hours.  ABG    Component Value Date/Time   PHART 7.220 (L) 08/05/2018 1331   PCO2ART 43.5 08/05/2018 1331   PO2ART 73.0 (L) 08/05/2018 1331   HCO3 17.8 (L) 08/05/2018 1331   TCO2 19 (L) 08/05/2018 1331   ACIDBASEDEF 10.0 (H) 08/05/2018 1331   O2SAT 91.0 08/05/2018 1331     Coagulation Profile: Recent Labs  Lab 08/05/18 1257  INR 1.20    Cardiac Enzymes: Recent Labs  Lab 08/05/18 1534  CKTOTAL 25*    HbA1C: No results found for: HGBA1C  CBG: Recent Labs  Lab 08/06/18 1520 08/06/18 2313 08/07/18 0320 08/07/18 0749 08/07/18 1125  GLUCAP 144* 95 96 130* 125*    Joneen Roach, AGACNP-BC [facility] Pulmonary/Critical Care Pager [phone] or [phone]  08/07/2018 12:33 PM

## 2018-08-07 NOTE — Progress Notes (Signed)
Changed HHN order from Duo to Xoponex.  Pt was in A-fib HR increased from 105 to 134 while receiving Duo.

## 2018-08-07 NOTE — Consult Note (Addendum)
Cardiology Consultation:   Patient ID: MARCELLUS PULLIAM MRN: [id number]; DOB: 06-07-1933  Admit date: 08/05/2018 Date of Consult: 08/07/2018  Primary Care Provider: Richardean Chimera, MD Primary Cardiologist: No primary care provider on file.  New today to Dr. Jacques Navy   Patient Profile:   RIESE HELLARD is a 84 y.o. male with a hx of hypertension, hyperlipidemia, obesity, CKD stage III, COPD who is being seen today for the evaluation of atrial fibrillation with RVR at the request of Dr. Catha Gosselin.  History of Present Illness:   Mr. Clayson had apparently developed weakness and falling and went to [facility] where he had blood work that showed severe hyperkalemia and acute kidney injury.  He had been on losartan at home.  He has had encephalopathy and confusion.  Today the patient is alert and oriented.  He denies any prior cardiac history or irregular heartbeat/arrhythmia/A. fib and does not see a cardiologist.  He denies diabetes or prior stroke.  He says that the only health issues he has are COPD and hypertension.  He lives alone in [place] and does his own housework.  He gets on his exercise bike for about 10 minutes daily.  He has chronic dyspnea on exertion that has been no worse lately.  He denies any kind of chest discomfort, palpitations, lightheadedness or syncope.  He also denies orthopnea or PND.  He denies any prior bleeding problems.  He smoked 1 pack/day since his teens and quit in 2006.  He denies alcohol use.  Initial EKG on 928 at 1128 looked like junctional rhythm in the 40s during period oh hyperkalemia with potassium of 7  In reviewing the cardiac monitor, the patient was in sinus rhythm and went into A. fib in the 130s about 11:00 last night.  Within the last couple of hours he went back into sinus rhythm in the 90s- 100s.  Past Medical History:  Diagnosis Date  . COPD (chronic obstructive pulmonary disease) (HCC)   . Hypertension   . Peripheral neuropathy      Past Surgical History:  Procedure Laterality Date  . ESOPHAGOGASTRODUODENOSCOPY N/A 03/23/2013   Procedure: ESOPHAGOGASTRODUODENOSCOPY (EGD);  Surgeon: Malissa Hippo, MD;  Location: [facility];  Service: Endoscopy;  Laterality: N/A;  1120  . TONSILLECTOMY       Home Medications:  Prior to Admission medications   Medication Sig Start Date End Date Taking? Authorizing Provider  ALPRAZolam (XANAX) 1 MG tablet Take 1 mg by mouth 2 (two) times daily before a meal.   Yes [provider]  amitriptyline (ELAVIL) 50 MG tablet Take 50 mg by mouth at bedtime.   Yes [provider]  aspirin 81 MG tablet Take 81 mg by mouth daily.   Yes [provider]  fish oil-omega-3 fatty acids 1000 MG capsule Take by mouth 2 (two) times daily before a meal.   Yes [provider]  FLUoxetine (PROZAC) 20 MG capsule Take 20 mg by mouth daily.   Yes [provider]  gabapentin (NEURONTIN) 300 MG capsule Take 300 mg by mouth 3 (three) times daily.    Yes [provider]  losartan (COZAAR) 100 MG tablet Take 100 mg by mouth daily.   Yes [provider]  omeprazole (PRILOSEC) 20 MG capsule Take 20 mg by mouth 2 (two) times daily before a meal.   Yes [provider]  traMADol (ULTRAM) 50 MG tablet Take 100 mg by mouth 4 (four) times daily. 05/10/18  Yes [provider]  levofloxacin (LEVAQUIN) 500 MG tablet Take 1 tablet (500 mg total) by mouth daily. Patient not taking: Reported on 08/05/2018 03/23/13   Malissa Hippo, MD  vitamin B-12 (CYANOCOBALAMIN) 1000 MCG tablet Take 1,000 mcg by mouth daily.    [provider]    Inpatient Medications: Scheduled Meds: . aspirin  81 mg Oral Daily  . Chlorhexidine Gluconate Cloth  6 each Topical Q0600  . furosemide  80 mg Intravenous Q12H  . heparin  5,000 Units Subcutaneous Q8H  . ipratropium  0.5 mg Nebulization TID  . levalbuterol  0.63 mg Nebulization TID  . mouth rinse  15 mL  Mouth Rinse BID  . sodium chloride flush  10-40 mL Intracatheter Q12H   Continuous Infusions: . cefTRIAXone (ROCEPHIN)  IV Stopped (08/07/18 1354)   PRN Meds: metoprolol tartrate, sodium chloride flush  Allergies:   No Known Allergies  Social History:   Social History   Socioeconomic History  . Marital status: Single    Spouse name: Not on file  . Number of children: Not on file  . Years of education: Not on file  . Highest education level: Not on file  Occupational History  . Not on file  Social Needs  . Financial resource strain: Not on file  . Food insecurity:    Worry: Not on file    Inability: Not on file  . Transportation needs:    Medical: Not on file    Non-medical: Not on file  Tobacco Use  . Smoking status: Former Games developer  . Tobacco comment: quit smoking 10 yrs ago x 50 20yrs  Substance and Sexual Activity  . Alcohol use: No  . Drug use: No  . Sexual activity: Not on file  Lifestyle  . Physical activity:    Days per week: Not on file    Minutes per session: Not on file  . Stress: Not on file  Relationships  . Social connections:    Talks on phone: Not on file    Gets together: Not on file    Attends religious service: Not on file    Active member of club or organization: Not on file    Attends meetings of clubs or organizations: Not on file    Relationship status: Not on file  . Intimate partner violence:    Fear of current or ex partner: Not on file    Emotionally abused: Not on file    Physically abused: Not on file    Forced sexual activity: Not on file  Other Topics Concern  . Not on file  Social History Narrative  . Not on file    Family History:    Family History  Problem Relation Age of Onset  . Leukemia Mother   . Suicidality Father   . CAD Brother        s/p CABG  . Stroke Brother        "small stroke"  . Suicidality Brother   . Suicidality Brother   . Other Brother        car accident     ROS:  Please see the history of  present illness.   All other ROS reviewed and negative.     Physical Exam/Data:   Vitals:   08/07/18 0850 08/07/18 1135 08/07/18 1400 08/07/18 1431  BP:   (!) 153/78   Pulse:   96   Resp:   (!) 21   Temp:  97.7 F (36.5 C)  TempSrc:  Oral    SpO2: 98%  98% 96%  Weight:      Height:        Intake/Output Summary (Last 24 hours) at 08/07/2018 1502 Last data filed at 08/07/2018 1400 Gross per 24 hour  Intake 288.49 ml  Output 3700 ml  Net -3411.51 ml   Filed Weights   08/05/18 1115 08/06/18 0500 08/07/18 0324  Weight: 101 kg 98.2 kg 94.9 kg   Body mass index is 30.02 kg/m.  General:  Well nourished, well developed, in no acute distress HEENT: normal Lymph: no adenopathy Neck: no JVD Endocrine:  No thryomegaly Vascular: No carotid bruits; FA pulses 2+ bilaterally without bruits  Cardiac:  normal S1, S2; RRR; no murmur  Lungs:  clear to auscultation bilaterally, no wheezing, rhonchi or rales  Abd: soft, nontender, no hepatomegaly  Ext: no edema Musculoskeletal:  No deformities, BUE and BLE strength normal and equal Skin: warm and dry  Neuro:  CNs 2-12 intact, no focal abnormalities noted Psych:  Normal affect   EKG:  The EKG was personally reviewed and demonstrates:  Atrial fibrillation with rapid ventricular response, 137 bpm, RBBB Telemetry:  Telemetry was personally reviewed and demonstrates: A. fib in the 130s since around 11:00 last night.  Currently sinus rhythm in the 90s-100s Relevant CV Studies:  Echocardiogram 08/06/2018 Study Conclusions - Left ventricle: The cavity size was normal. Systolic function was   normal. The estimated ejection fraction was in the range of 60%   to 65%. Although no diagnostic regional wall motion abnormality   was identified, this possibility cannot be completely excluded on   the basis of this study. - Tricuspid valve: There was moderate regurgitation. - Pulmonary arteries: Systolic pressure was moderately increased.   PA peak  pressure: 61 mm Hg (S), assuming mean RA pressure 8 mm   Hg. Unable to see the inferior vena cava.   Laboratory Data:  Chemistry Recent Labs  Lab 08/06/18 0430 08/06/18 1546 08/07/18 0442  NA 141 138 141  K 4.4 4.5 3.7  CL 103 99 101  CO2 27 29 31   GLUCOSE 129* 131* 101*  BUN 43* 34* 33*  CREATININE 2.34* 1.99* 1.79*  CALCIUM 8.5* 8.0* 8.2*  GFRNONAA 24* 29* 33*  GFRAA 28* 34* 39*  ANIONGAP 11 10 9     Recent Labs  Lab 08/05/18 1257  08/06/18 0430 08/06/18 1546 08/07/18 0442  PROT 5.8*  --   --   --   --   ALBUMIN 3.0*   < > 3.1* 2.8* 2.9*  AST 21  --   --   --   --   ALT 22  --   --   --   --   ALKPHOS 60  --   --   --   --   BILITOT 0.6  --   --   --   --    < > = values in this interval not displayed.   Hematology Recent Labs  Lab 08/05/18 1257 08/07/18 0442  WBC 16.3* 14.1*  RBC 3.88* 4.11*  HGB 11.6* 12.1*  HCT 39.1 39.8  MCV 100.8* 96.8  MCH 29.9 29.4  MCHC 29.7* 30.4  RDW 16.0* 14.8  PLT 189 164   Cardiac EnzymesNo results for input(s): TROPONINI in the last 168 hours. No results for input(s): TROPIPOC in the last 168 hours.  BNPNo results for input(s): BNP, PROBNP in the last 168 hours.  DDimer No results for input(s): DDIMER in the last  168 hours.  Radiology/Studies:  Ct Head Wo Contrast  Result Date: 08/07/2018 CLINICAL DATA:  Confusion and ataxia. Head trauma. History of hypertension, encephalopathy. EXAM: CT HEAD WITHOUT CONTRAST TECHNIQUE: Contiguous axial images were obtained from the base of the skull through the vertex without intravenous contrast. COMPARISON:  None. FINDINGS: BRAIN: No intraparenchymal hemorrhage, mass effect nor midline shift. The ventricles and sulci are normal for age. Patchy to confluent supratentorial white matter hypodensities. 1 cm hypodensity RIGHT inferior basal ganglia. No acute large vascular territory infarcts. No abnormal extra-axial fluid collections. Basal cisterns are patent. VASCULAR: Moderate calcific  atherosclerosis of the carotid siphons. SKULL: No skull fracture. No significant scalp soft tissue swelling. SINUSES/ORBITS: Trace paranasal sinus mucosal thickening. Mastoid air cells are well aerated.The included ocular globes and orbital contents are non-suspicious. Status post LEFT ocular lens implant. OTHER: None. IMPRESSION: 1. No acute intracranial process. 2. Moderate to severe chronic small vessel ischemic changes. RIGHT basal ganglia lacunar infarct versus perivascular space. Electronically Signed   By: Awilda Metro M.D.   On: 08/07/2018 00:21   US Renal  Result Date: 08/06/2018 CLINICAL DATA:  Acute kidney injury EXAM: RENAL / URINARY TRACT ULTRASOUND COMPLETE COMPARISON:  None. FINDINGS: Right Kidney: Length: 12.9 cm. Echogenic renal parenchyma. Cortical thinning. No mass or hydronephrosis. Left Kidney: Length: 10.8 cm. Echogenic renal parenchyma. Cortical thinning/atrophy. No mass or hydronephrosis. Bladder: Not discretely visualized/underdistended. IMPRESSION: Bilateral renal cortical thinning/atrophy, left greater than right. Echogenic renal parenchyma, suggesting medical renal disease. No hydronephrosis. Bladder is not discretely visualized/underdistended. Electronically Signed   By: Charline Bills M.D.   On: 08/06/2018 01:59   Dg Chest Port 1 View  Result Date: 08/07/2018 CLINICAL DATA:  Community acquired pneumonia EXAM: PORTABLE CHEST 1 VIEW COMPARISON:  08/05/2018 FINDINGS: Right Vas-Cath remains in place, unchanged. Cardiomegaly. Bibasilar atelectasis. Mild vascular congestion. No visible effusions or acute bony abnormality. IMPRESSION: Cardiomegaly with vascular congestion.  Bibasilar atelectasis. Electronically Signed   By: Charlett Nose M.D.   On: 08/07/2018 07:22   Dg Chest Port 1 View  Result Date: 08/05/2018 CLINICAL DATA:  Central venous catheter placement EXAM: PORTABLE CHEST 1 VIEW COMPARISON:  08/05/2018, 08/04/2018 FINDINGS: Placement of right-sided central venous  catheter with tip superimposing the jugular brachiocephalic confluence. No right pneumothorax. Continued cardiomegaly with streaky atelectasis or infiltrate at the left base, slightly improved. IMPRESSION: 1. Right central venous catheter tip overlies the jugular/brachiocephalic confluence. There is no right pneumothorax 2. Cardiomegaly. Streaky atelectasis or infiltrate at the left base with some improvement in aeration. Electronically Signed   By: Jasmine Pang M.D.   On: 08/05/2018 21:53   Dg Chest Port 1 View  Result Date: 08/05/2018 CLINICAL DATA:  Shortness of breath EXAM: PORTABLE CHEST 1 VIEW COMPARISON:  August 04, 2018 FINDINGS: There is airspace consolidation in the left base with small left pleural effusion. Lungs elsewhere are clear. Heart is mildly enlarged with pulmonary vascularity normal. No adenopathy. There is aortic atherosclerosis. No evident bone lesions. IMPRESSION: Airspace consolidation left base consistent with pneumonia. Small left pleural effusion. Lungs elsewhere clear. Stable cardiomegaly. There is aortic atherosclerosis. Aortic Atherosclerosis (ICD10-I70.0). Electronically Signed   By: Bretta Bang III M.D.   On: 08/05/2018 14:13    Assessment and Plan:   Paroxysmal atrial fibrillation -Patient developed atrial fibrillation last evening in the 130s.  Currently in sinus rhythm. -No prior known history of A. Fib, however possible old right basal ganglia lacunar infarct on CT raises the question of prior silent A. Fib. -CHA2DS2/VAS Stroke  Risk Score is at least 3 (HTN, Age (2)) possibly 5 if consider old stroke.  He should be initiated on anticoagulation for stroke risk reduction.  Renal function is improving.  He may be able to use a DOAC, Renal dosing if okay with nephrology versus warfarin (would rather not use unless necessary). -Discontinue aspirin once anticoagulation started.  -Will start metoprolol 25 mg twice daily and see how he tolerates. -Check  hemoglobin A1c and lipid panel for risk stratification -I will arrange for outpatient follow-up with our office in [place].       Acute renal insufficiency -CRRT was discontinued yesterday, ran for <24 hrs.  -Serum creatinine has improved from 5.21 to 1.79.  Nephrology is following.  -Pt is on lasix 80 mg BID.  No indication of heart failure.  We will continue to allow nephrology to manage diuresis. -3.2 L UOP yesterday  COPD -currently stable   Hypertension -Watch BP with addition of metoprolol   [facility] will sign off.   Medication Recommendations: metoprolol 25 mg bid for rate control, DOAC if OK with nephrology Other recommendations (labs, testing, etc):   Follow up as an outpatient:  Follow up in our office in [place].   For questions or updates, please contact [facility] Please consult [email or web address] for contact info under     Signed, Berton Bon, NP  08/07/2018 3:02 PM  -------------------------------------------------------------------------------------- History and all data above reviewed.  Patient examined.  I agree with the findings as above.    Mr. Scripter is a pleasant 84 year old gentleman with a history of hypertension, hyperlipidemia, obesity, CKD stage III, and COPD.  We have been asked to see the patient for atrial fibrillation with rapid ventricular response at the request of Dr. Catha Gosselin.  Mr. Heber presented to the hospital with weakness and falling and was found to have severe hyperkalemia and acute renal failure.  He was encephalopathic.  On transfer to [facility] he was initiated on CRRT, and has improved significantly.  He denies any previous cardiovascular or arrhythmia history.  He has no known history of major bleeding.  He is able to complete his daily housework and exercise on a stationary bike for 10 minutes daily without chest discomfort, lightheadedness, or palpitations.  He endorses exertional dyspnea that is chronic and  unchanged.  Previous 1 pack/day smoker, quit in 2006.  At approximately 11 PM yesterday evening he went into atrial fibrillation with rapid ventricular response with a heart rate of approximately 130.  At the time of cardiology consultation, he had converted to sinus rhythm and was hemodynamically stable.  I do not see that the patient received amiodarone at any point to assist with rhythm conversion.  Constitutional: No acute distress Eyes: pupils equally round and reactive to light, sclera non-icteric,  ENMT: moist mucous membranes Cardiovascular: regular rhythm, normal rate, no murmurs. S1 and S2 normal. Respiratory: bibasilar crackles GI : normal bowel sounds, soft and nontender. No distention.   MSK: extremities warm, well perfused. No edema.  NEURO: grossly nonfocal exam, moves all extremities. PSYCH: alert and oriented x 3, normal mood and affect.   All available labs, radiology testing, previous records reviewed. Agree with documented assessment and plan.  Active Problems:   AKI (acute kidney injury) (HCC)   Hyperkalemia   Encephalopathy   Pulmonary edema   Paroxysmal atrial fibrillation [facility])  With Mr. Hershman conversion to sinus rhythm spontaneously, likely impacted by improvement on CRRT, no further cardiovascular changes are necessary.  With this, cardiology will  sign off with the following recommendations:  - Please initiate metoprolol tartrate 25 mg twice daily, please assess how the patient tolerates this overnight. - With no previous history of bleeding and a chads2 vasc score of 5 (HTN, Age, previous stroke), he can be initiated on a DOAC with renal dosing at the discretion of nephrology and pharmacy.  - No clinical evidence of significant heart failure, diuretic can be dictated by nephrology as needed for renal clearance.  From a cardiovascular standpoint no continuing indication for furosemide. - The patient would like to follow-up with cardiology in [place], [facility] will sign off.   Medication Recommendations:  Metoprolol tartrate 25 mg bid to assist with rate control, can be adjusted if hypotension or bradycardia. DOAC renally dosed during acute hospitalization, dose adjusted for renal function on dismissal per pharmacy. Other recommendations (labs, testing, etc): -- Follow up as an outpatient: [place], [place].   Parke Poisson  8:20 AM  08/08/2018

## 2018-08-07 NOTE — Progress Notes (Signed)
[facility] ROUNDING NOTE   Subjective:   Confusion overnight.   CRRT discontinued yesterday, ran for < 24hrs.  UOP 3.2L yesterday   Objective:  Vital signs in last 24 hours:  Temp:  [95.9 F (35.5 C)-98.5 F (36.9 C)] 97.7 F (36.5 C) (09/30 0757) Pulse Rate:  [27-117] 27 (09/30 0800) Resp:  [16-31] 20 (09/30 0800) BP: (104-159)/(35-137) 150/89 (09/30 0800) SpO2:  [91 %-99 %] 98 % (09/30 0850) Weight:  [94.9 kg] 94.9 kg (09/30 0324)  Weight change: -6.1 kg Filed Weights   08/05/18 1115 08/06/18 0500 08/07/18 0324  Weight: 101 kg 98.2 kg 94.9 kg    Intake/Output: I/O last 3 completed shifts: In: 188.5 [IV Piggyback:188.5] Out: 1610 [RUEAV:4098; Other:1157]   Intake/Output this shift:  Total I/O In: -  Out: 500 [Urine:500]  Gen - lying comfortably at 30 degrees CVS- tachy in 110s, II/VI s murmur RS- CTA diminished at bases 3 l nasal cannula ABD- BS present soft non-distended EXT- no edema Neuro - oriented to self 2019, [facility]   Basic Metabolic Panel: Recent Labs  Lab 08/05/18 1257 08/05/18 2000 08/06/18 0430 08/06/18 0858 08/06/18 1546 08/07/18 0442  NA 135 136 141  --  138 141  K 7.0* 3.8 4.4  --  4.5 3.7  CL 104 102 103  --  99 101  CO2 19* 22 27  --  29 31  GLUCOSE 217* 156* 129*  --  131* 101*  BUN 84* 55* 43*  --  34* 33*  CREATININE 5.21* 3.19* 2.34*  --  1.99* 1.79*  CALCIUM 9.4 8.4* 8.5*  --  8.0* 8.2*  MG  --   --   --  2.3  --  1.9  PHOS  --  3.7 3.4  --  3.2 3.0  3.0    Liver Function Tests: Recent Labs  Lab 08/05/18 1257 08/05/18 2000 08/06/18 0430 08/06/18 1546 08/07/18 0442  AST 21  --   --   --   --   ALT 22  --   --   --   --   ALKPHOS 60  --   --   --   --   BILITOT 0.6  --   --   --   --   PROT 5.8*  --   --   --   --   ALBUMIN 3.0* 3.1* 3.1* 2.8* 2.9*   No results for input(s): LIPASE, AMYLASE in the last 168 hours. No results for input(s): AMMONIA in the last 168 hours.  CBC: Recent Labs  Lab  08/05/18 1257 08/07/18 0442  WBC 16.3* 14.1*  NEUTROABS 14.0*  --   HGB 11.6* 12.1*  HCT 39.1 39.8  MCV 100.8* 96.8  PLT 189 164    Cardiac Enzymes: Recent Labs  Lab 08/05/18 1534  CKTOTAL 25*    BNP: Invalid input(s): POCBNP  CBG: Recent Labs  Lab 08/06/18 1131 08/06/18 1520 08/06/18 2313 08/07/18 0320 08/07/18 0749  GLUCAP 141* 144* 95 96 130*    Microbiology: Results for orders placed or performed during the hospital encounter of 08/05/18  MRSA PCR Screening     Status: None   Collection Time: 08/05/18 11:33 AM  Result Value Ref Range Status   MRSA by PCR NEGATIVE NEGATIVE Final    Comment:        The GeneXpert MRSA Assay (FDA approved for NASAL specimens only), is one component of a comprehensive MRSA colonization surveillance program. It is not intended to diagnose  MRSA infection nor to guide or monitor treatment for MRSA infections. Performed at [facility], [address] [place], [place] [address]     Coagulation Studies: Recent Labs    08/05/18 1257  LABPROT 15.1  INR 1.20    Urinalysis: Recent Labs    08/05/18 2003  COLORURINE YELLOW  LABSPEC 1.008  PHURINE 5.0  GLUCOSEU NEGATIVE  HGBUR MODERATE*  BILIRUBINUR NEGATIVE  KETONESUR NEGATIVE  PROTEINUR NEGATIVE  NITRITE NEGATIVE  LEUKOCYTESUR TRACE*      Imaging: Ct Head Wo Contrast  Result Date: 08/07/2018 CLINICAL DATA:  Confusion and ataxia. Head trauma. History of hypertension, encephalopathy. EXAM: CT HEAD WITHOUT CONTRAST TECHNIQUE: Contiguous axial images were obtained from the base of the skull through the vertex without intravenous contrast. COMPARISON:  None. FINDINGS: BRAIN: No intraparenchymal hemorrhage, mass effect nor midline shift. The ventricles and sulci are normal for age. Patchy to confluent supratentorial white matter hypodensities. 1 cm hypodensity RIGHT inferior basal ganglia. No acute large vascular territory infarcts. No abnormal extra-axial fluid  collections. Basal cisterns are patent. VASCULAR: Moderate calcific atherosclerosis of the carotid siphons. SKULL: No skull fracture. No significant scalp soft tissue swelling. SINUSES/ORBITS: Trace paranasal sinus mucosal thickening. Mastoid air cells are well aerated.The included ocular globes and orbital contents are non-suspicious. Status post LEFT ocular lens implant. OTHER: None. IMPRESSION: 1. No acute intracranial process. 2. Moderate to severe chronic small vessel ischemic changes. RIGHT basal ganglia lacunar infarct versus perivascular space. Electronically Signed   By: Awilda Metro M.D.   On: 08/07/2018 00:21   US Renal  Result Date: 08/06/2018 CLINICAL DATA:  Acute kidney injury EXAM: RENAL / URINARY TRACT ULTRASOUND COMPLETE COMPARISON:  None. FINDINGS: Right Kidney: Length: 12.9 cm. Echogenic renal parenchyma. Cortical thinning. No mass or hydronephrosis. Left Kidney: Length: 10.8 cm. Echogenic renal parenchyma. Cortical thinning/atrophy. No mass or hydronephrosis. Bladder: Not discretely visualized/underdistended. IMPRESSION: Bilateral renal cortical thinning/atrophy, left greater than right. Echogenic renal parenchyma, suggesting medical renal disease. No hydronephrosis. Bladder is not discretely visualized/underdistended. Electronically Signed   By: Charline Bills M.D.   On: 08/06/2018 01:59   Dg Chest Port 1 View  Result Date: 08/07/2018 CLINICAL DATA:  Community acquired pneumonia EXAM: PORTABLE CHEST 1 VIEW COMPARISON:  08/05/2018 FINDINGS: Right Vas-Cath remains in place, unchanged. Cardiomegaly. Bibasilar atelectasis. Mild vascular congestion. No visible effusions or acute bony abnormality. IMPRESSION: Cardiomegaly with vascular congestion.  Bibasilar atelectasis. Electronically Signed   By: Charlett Nose M.D.   On: 08/07/2018 07:22   Dg Chest Port 1 View  Result Date: 08/05/2018 CLINICAL DATA:  Central venous catheter placement EXAM: PORTABLE CHEST 1 VIEW COMPARISON:   08/05/2018, 08/04/2018 FINDINGS: Placement of right-sided central venous catheter with tip superimposing the jugular brachiocephalic confluence. No right pneumothorax. Continued cardiomegaly with streaky atelectasis or infiltrate at the left base, slightly improved. IMPRESSION: 1. Right central venous catheter tip overlies the jugular/brachiocephalic confluence. There is no right pneumothorax 2. Cardiomegaly. Streaky atelectasis or infiltrate at the left base with some improvement in aeration. Electronically Signed   By: Jasmine Pang M.D.   On: 08/05/2018 21:53   Dg Chest Port 1 View  Result Date: 08/05/2018 CLINICAL DATA:  Shortness of breath EXAM: PORTABLE CHEST 1 VIEW COMPARISON:  August 04, 2018 FINDINGS: There is airspace consolidation in the left base with small left pleural effusion. Lungs elsewhere are clear. Heart is mildly enlarged with pulmonary vascularity normal. No adenopathy. There is aortic atherosclerosis. No evident bone lesions. IMPRESSION: Airspace consolidation left base consistent  with pneumonia. Small left pleural effusion. Lungs elsewhere clear. Stable cardiomegaly. There is aortic atherosclerosis. Aortic Atherosclerosis (ICD10-I70.0). Electronically Signed   By: Bretta Bang III M.D.   On: 08/05/2018 14:13     Medications:   . cefTRIAXone (ROCEPHIN)  IV Stopped (08/06/18 1521)   . Chlorhexidine Gluconate Cloth  6 each Topical Q0600  . furosemide  80 mg Intravenous Q12H  . heparin  5,000 Units Subcutaneous Q8H  . ipratropium-albuterol  3 mL Nebulization Q6H  . mouth rinse  15 mL Mouth Rinse BID  . sodium chloride flush  10-40 mL Intracatheter Q12H   heparin, heparin, sodium chloride flush  Assessment/ Plan:   AKI, severe, nonoliguric:  Was started on CRRT on presentation with hyperkalemia, metabolic acidosis.  Was taking ARB outpatient, several weeks of unsteadiness.  Renal US ok, CK 25. UA on admission +RBC, WBC ? Foley related.  Would repeat once foley out  for a few days.  No proteinuria.    Hyperkalemia resolved with CRRT. K 3.7 this AM. Continue to monitor.   Metabolic acidosis resolved with CRRT, CTM  Hypervolemia resolved with IV diuretic and CRRT.   Congestive heart failure 2D echo yesterday showed EF 60-65%  Possible pneumonia empiric Rocephin per primary team. He does say has had cough for a few weeks.   Altered mental status appears improved.  CT with small vessel dz and right basal ganglia lacunar infarct versus perivascular space.    LOS: 2 Isaiah Brady @TODAY @10 :47 AM

## 2018-08-08 DIAGNOSIS — E872 Acidosis, unspecified: Secondary | ICD-10-CM

## 2018-08-08 DIAGNOSIS — G9341 Metabolic encephalopathy: Secondary | ICD-10-CM

## 2018-08-08 LAB — RENAL FUNCTION PANEL
ALBUMIN: 2.8 g/dL — AB (ref 3.5–5.0)
ANION GAP: 8 (ref 5–15)
BUN: 33 mg/dL — ABNORMAL HIGH (ref 8–23)
CALCIUM: 8 mg/dL — AB (ref 8.9–10.3)
CO2: 34 mmol/L — ABNORMAL HIGH (ref 22–32)
Chloride: 98 mmol/L (ref 98–111)
Creatinine, Ser: 1.68 mg/dL — ABNORMAL HIGH (ref 0.61–1.24)
GFR, EST AFRICAN AMERICAN: 42 mL/min — AB (ref >60)
GFR, EST NON AFRICAN AMERICAN: 36 mL/min — AB (ref >60)
Glucose, Bld: 101 mg/dL — ABNORMAL HIGH (ref 70–99)
PHOSPHORUS: 2.5 mg/dL (ref 2.5–4.6)
POTASSIUM: 3.6 mmol/L (ref 3.5–5.1)
SODIUM: 140 mmol/L (ref 135–145)

## 2018-08-08 LAB — CBC
HEMATOCRIT: 42 % (ref 39.0–52.0)
Hemoglobin: 13.1 g/dL (ref 13.0–17.0)
MCH: 29.8 pg (ref 26.0–34.0)
MCHC: 31.2 g/dL (ref 30.0–36.0)
MCV: 95.5 fL (ref 78.0–100.0)
PLATELETS: 172 10*3/uL (ref 150–400)
RBC: 4.4 MIL/uL (ref 4.22–5.81)
RDW: 14.6 % (ref 11.5–15.5)
WBC: 11.9 10*3/uL — AB (ref 4.0–10.5)

## 2018-08-08 LAB — LIPID PANEL
CHOLESTEROL: 143 mg/dL (ref 0–200)
HDL: 38 mg/dL — ABNORMAL LOW (ref >40)
LDL Cholesterol: 60 mg/dL (ref 0–99)
Total CHOL/HDL Ratio: 3.8 RATIO
Triglycerides: 223 mg/dL — ABNORMAL HIGH (ref <150)
VLDL: 45 mg/dL — ABNORMAL HIGH (ref 0–40)

## 2018-08-08 LAB — PROCALCITONIN: PROCALCITONIN: 0.12 ng/mL

## 2018-08-08 LAB — APTT: APTT: 32 s (ref 24–36)

## 2018-08-08 LAB — MAGNESIUM: MAGNESIUM: 1.8 mg/dL (ref 1.7–2.4)

## 2018-08-08 MED ORDER — LEVALBUTEROL HCL 0.63 MG/3ML IN NEBU: 0.6300 mg | INHALATION_SOLUTION | Freq: Four times a day (QID) | RESPIRATORY_TRACT | Status: DC | PRN

## 2018-08-08 MED ORDER — ALPRAZOLAM 0.5 MG PO TABS
1.0000 mg | ORAL_TABLET | Freq: Once | ORAL | Status: AC
  Administered 2018-08-09: 1 mg via ORAL
  Filled 2018-08-08: qty 2

## 2018-08-08 MED ORDER — IPRATROPIUM BROMIDE 0.02 % IN SOLN: 0.5000 mg | Freq: Four times a day (QID) | RESPIRATORY_TRACT | Status: DC | PRN

## 2018-08-08 NOTE — Care Management Important Message (Signed)
Important Message  Patient Details  Name: Isaiah Brady MRN: [id number] Date of Birth: [birth year]   Medicare Important Message Given:  Yes    Glynn Yepes 08/08/2018, 1:01 PM

## 2018-08-08 NOTE — Progress Notes (Signed)
Nutrition Brief Note  Patient identified on the Malnutrition Screening Tool (MST) Report.  84 year old male who presented on 9/28 for dialysis catheter insertion for severe hyperkalemia in the setting of ARB use requiring emergent CRRT. Pt was on CRRT for < 24 hours. PMH significant for hypertension, hyperlipidemia, CKD stage III, diabetes mellitus, and COPD.  Spoke with pt and family members at bedside. Pt reports having a "great" appetite and wondering when he will be able to eat solid foods. RD discussed with RN and MD. MD approved diet advancement to Low Sodium. RD ordered 2 gram sodium diet.  Pt endorses weight loss but reports it was intentional as he wants to lose weight. Pt states, "I started cutting out sweets."  NFPE completed. Findings are no fat depletion, fat no muscle depletion, and no edema.  Wt Readings from Last 15 Encounters:  08/08/18 94.4 kg  03/23/13 97.5 kg  03/12/13 98.9 kg    Body mass index is 29.86 kg/m. Patient meets criteria for overweight based on current BMI.   Current diet order just advanced to 2 gram sodium. Pt was on clear liquid diet since 9/28 with 75% meal completion during this time. Noted 90% clear liquid lunch tray at bedside.  Labs and medications reviewed.   No nutrition interventions warranted at this time. If nutrition issues arise, please consult RD.    Earma Reading, MS, RD, LDN Inpatient Clinical Dietitian Pager: [phone] Weekend/After Hours: [phone]

## 2018-08-08 NOTE — Progress Notes (Signed)
[facility] PROGRESS NOTE    Progress Note  Isaiah Brady [facility]  WUJ:[id number] DOB: [birth year] DOA: 08/05/2018 PCP: Richardean Chimera, MD     Brief Narrative:   Isaiah Brady is an 84 y.o. male past medical history of obesity, diabetes mellitus, hypertension lives alone last seen in his usual state of health about 2 weeks ago, his son noticed that he had become increasingly unsteady and falling several times the last one happened the day of admission at a restaurant was found to be in acute renal failure with hyperkalemia, nephrology was consulted for emergent dialysis  Assessment/Plan:   AKI (acute kidney injury) (HCC) on chronic kidney disease stage III/metabolic acidosis In the setting of ARB use admitted on 08/05/2017 for emergent dialysis. Renal ultrasound did not reveal hydronephrosis normal-appearing kidneys. CK within normal limits. Urine output appears to be improving.  Continue to monitor. Continue to hold ARB's.  Acute respiratory failure with hypoxia: The setting of volume overload he was treated with CPAP suspect largely due to pulmonary edema now resolved.  Remain afebrile with no leukocytosis discontinue IV antibiotics.  Acute metabolic encephalopathy: Likely metabolic from uremia has resolved with dialysis.  Hyperkalemia Improved with CRRT.  Metabolic acidosis: Improved with a CRRT  New onset atrial fibrillation: CHA2DS2/VAS Stroke Risk Score is at least 5. Etiology was consulted he was started on metoprolol p.o. twice daily and Eliquis. 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   DVT prophylaxis: lovexno Family Communication:none Disposition Plan/Barrier to D/C: unable to detrmine Code Status:     Code Status Orders  (From admission, onward)         Start     Ordered   08/05/18 1223  Full code  Continuous     08/05/18 1222        Code Status History      This patient has a current code status but no historical code status.    Advance Directive Documentation     Most Recent Value  Type of Advance Directive  Healthcare Power of Attorney  Pre-existing out of facility DNR order (yellow form or pink MOST form)  -  "MOST" Form in Place?  -        IV Access:    Peripheral IV   Procedures and diagnostic studies:   Ct Head Wo Contrast  Result Date: 08/07/2018 CLINICAL DATA:  Confusion and ataxia. Head trauma. History of hypertension, encephalopathy. EXAM: CT HEAD WITHOUT CONTRAST TECHNIQUE: Contiguous axial images were obtained from the base of the skull through the vertex without intravenous contrast. COMPARISON:  None. FINDINGS: BRAIN: No intraparenchymal hemorrhage, mass effect nor midline shift. The ventricles and sulci are normal for age. Patchy to confluent supratentorial white matter hypodensities. 1 cm hypodensity RIGHT inferior basal ganglia. No acute large vascular territory infarcts. No abnormal extra-axial fluid collections. Basal cisterns are patent. VASCULAR: Moderate calcific atherosclerosis of the carotid siphons. SKULL: No skull fracture. No significant scalp soft tissue swelling. SINUSES/ORBITS: Trace paranasal sinus mucosal thickening. Mastoid air cells are well aerated.The included ocular globes and orbital contents are non-suspicious. Status post LEFT ocular lens implant. OTHER: None. IMPRESSION: 1. No acute intracranial process. 2. Moderate to severe chronic small vessel ischemic changes. RIGHT basal ganglia lacunar infarct versus perivascular space. Electronically Signed   By: Awilda Metro M.D.   On: 08/07/2018 00:21   Dg Chest Port 1 View  Result Date: 08/07/2018 CLINICAL DATA:  Community  acquired pneumonia EXAM: PORTABLE CHEST 1 VIEW COMPARISON:  08/05/2018 FINDINGS: Right Vas-Cath remains in place, unchanged. Cardiomegaly. Bibasilar atelectasis. Mild vascular congestion. No visible effusions or acute bony  abnormality. IMPRESSION: Cardiomegaly with vascular congestion.  Bibasilar atelectasis. Electronically Signed   By: Charlett Nose M.D.   On: 08/07/2018 07:22     Medical Consultants:    None.  Anti-Infectives:   None  Subjective:    Isaiah Brady still nauseated with good urine output no diarrhea last bowel movement yesterday solid.  Objective:    Vitals:   08/08/18 0356 08/08/18 0528 08/08/18 0855 08/08/18 0904  BP:  126/70 (!) 172/84   Pulse:  93 91   Resp:  18    Temp:  97.8 F (36.6 C)    TempSrc:  Oral    SpO2:  99%  99%  Weight: 94.4 kg     Height:        Intake/Output Summary (Last 24 hours) at 08/08/2018 0936 Last data filed at 08/08/2018 0859 Gross per 24 hour  Intake 580 ml  Output 925 ml  Net -345 ml   Filed Weights   08/06/18 0500 08/07/18 0324 08/08/18 0356  Weight: 98.2 kg 94.9 kg 94.4 kg    Exam: General exam: In no acute distress. Respiratory system: Good air movement and clear to auscultation. Cardiovascular system: S1 & S2 heard, RRR. No JVD, murmurs, rubs, gallops or clicks.  Gastrointestinal system: Abdomen is nondistended, soft and nontender.  Central nervous system: Alert and oriented. No focal neurological deficits. Extremities: No pedal edema. Skin: No rashes, lesions or ulcers Psychiatry: Judgement and insight appear normal. Mood & affect appropriate.    Data Reviewed:    Labs: Basic Metabolic Panel: Recent Labs  Lab 08/05/18 2000 08/06/18 0430 08/06/18 0858 08/06/18 1546 08/07/18 0442 08/08/18 0341  NA 136 141  --  138 141 140  K 3.8 4.4  --  4.5 3.7 3.6  CL 102 103  --  99 101 98  CO2 22 27  --  29 31 34*  GLUCOSE 156* 129*  --  131* 101* 101*  BUN 55* 43*  --  34* 33* 33*  CREATININE 3.19* 2.34*  --  1.99* 1.79* 1.68*  CALCIUM 8.4* 8.5*  --  8.0* 8.2* 8.0*  MG  --   --  2.3  --  1.9 1.8  PHOS 3.7 3.4  --  3.2 3.0  3.0 2.5   GFR Estimated Creatinine Clearance: 38.5 mL/min (A) (by C-G formula based on SCr of  1.68 mg/dL (H)). Liver Function Tests: Recent Labs  Lab 08/05/18 1257 08/05/18 2000 08/06/18 0430 08/06/18 1546 08/07/18 0442 08/08/18 0341  AST 21  --   --   --   --   --   ALT 22  --   --   --   --   --   ALKPHOS 60  --   --   --   --   --   BILITOT 0.6  --   --   --   --   --   PROT 5.8*  --   --   --   --   --   ALBUMIN 3.0* 3.1* 3.1* 2.8* 2.9* 2.8*   No results for input(s): LIPASE, AMYLASE in the last 168 hours. No results for input(s): AMMONIA in the last 168 hours. Coagulation profile Recent Labs  Lab 08/05/18 1257  INR 1.20    CBC: Recent Labs  Lab 08/05/18 1257 08/07/18  1610 08/08/18 0341  WBC 16.3* 14.1* 11.9*  NEUTROABS 14.0*  --   --   HGB 11.6* 12.1* 13.1  HCT 39.1 39.8 42.0  MCV 100.8* 96.8 95.5  PLT 189 164 172   Cardiac Enzymes: Recent Labs  Lab 08/05/18 1534  CKTOTAL 25*   BNP (last 3 results) No results for input(s): PROBNP in the last 8760 hours. CBG: Recent Labs  Lab 08/06/18 1520 08/06/18 2313 08/07/18 0320 08/07/18 0749 08/07/18 1125  GLUCAP 144* 95 96 130* 125*   D-Dimer: No results for input(s): DDIMER in the last 72 hours. Hgb A1c: No results for input(s): HGBA1C in the last 72 hours. Lipid Profile: Recent Labs    08/08/18 0341  CHOL 143  HDL 38*  LDLCALC 60  TRIG 960*  CHOLHDL 3.8   Thyroid function studies: No results for input(s): TSH, T4TOTAL, T3FREE, THYROIDAB in the last 72 hours.  Invalid input(s): FREET3 Anemia work up: No results for input(s): VITAMINB12, FOLATE, FERRITIN, TIBC, IRON, RETICCTPCT in the last 72 hours. Sepsis Labs: Recent Labs  Lab 08/05/18 1257 08/07/18 0442 08/08/18 0341  PROCALCITON  --   --  0.12  WBC 16.3* 14.1* 11.9*  LATICACIDVEN 2.8*  --   --    Microbiology Recent Results (from the past 240 hour(s))  MRSA PCR Screening     Status: None   Collection Time: 08/05/18 11:33 AM  Result Value Ref Range Status   MRSA by PCR NEGATIVE NEGATIVE Final    Comment:        The  GeneXpert MRSA Assay (FDA approved for NASAL specimens only), is one component of a comprehensive MRSA colonization surveillance program. It is not intended to diagnose MRSA infection nor to guide or monitor treatment for MRSA infections. Performed at [facility], [address], [place], [place] [address]      Medications:   . aspirin  81 mg Oral Daily  . Chlorhexidine Gluconate Cloth  6 each Topical Q0600  . furosemide  80 mg Intravenous Q12H  . heparin  5,000 Units Subcutaneous Q8H  . ipratropium  0.5 mg Nebulization TID  . levalbuterol  0.63 mg Nebulization TID  . mouth rinse  15 mL Mouth Rinse BID  . metoprolol tartrate  25 mg Oral BID  . sodium chloride flush  10-40 mL Intracatheter Q12H   Continuous Infusions: . cefTRIAXone (ROCEPHIN)  IV Stopped (08/07/18 1354)     LOS: 3 days   Marinda Elk  [facility] Pager [phone]  *Please refer to [email or web address], password [email or web address] to get updated schedule on who will round on this patient, as hospitalists switch teams weekly. If 7PM-7AM, please contact night-coverage at [email or web address], password [email or web address] for any overnight needs.  08/08/2018, 9:36 AM

## 2018-08-08 NOTE — Progress Notes (Signed)
Transfer from 22M-ICU at 2115, alert and oriented x 4, denies pain with assess.  Telesitter transported with patient no distress.  Patient on stepdown monitoring, noted with PVC's.  Stable condition upon transfer, will continue to monitor.

## 2018-08-08 NOTE — Plan of Care (Signed)
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
## 2018-08-08 NOTE — Progress Notes (Signed)
[facility] ROUNDING NOTE   Subjective:   Moved from ICU to floor.  Had A fib - new.  Cardiology consulted. Rec BB and DOAC renally dosed, signed off. He feels good today.  Son at bedside. Worked with PT.    Objective:  Vital signs in last 24 hours:  Temp:  [97.8 F (36.6 C)-98.4 F (36.9 C)] 97.8 F (36.6 C) (10/01 0528) Pulse Rate:  [91-101] 91 (10/01 0855) Resp:  [16-26] 18 (10/01 0528) BP: (126-172)/(70-103) 172/84 (10/01 0855) SpO2:  [95 %-99 %] 99 % (10/01 0904) Weight:  [94.4 kg] 94.4 kg (10/01 0356)  Weight change: -0.5 kg Filed Weights   08/06/18 0500 08/07/18 0324 08/08/18 0356  Weight: 98.2 kg 94.9 kg 94.4 kg    Intake/Output: I/O last 3 completed shifts: In: 758.5 [I.V.:20; Other:450; IV Piggyback:288.5] Out: 3825 [Urine:3825]   Intake/Output this shift:  Total I/O In: 10 [I.V.:10] Out: -   Gen - lying comfortably at 30 degrees CVS- Reg rate, irreg, II/VI s murmur RS- normal WOB, on 2L [place] ABD- BS present soft non-distended EXT- no edema Neuro - oriented to self 2019, [facility]   Basic Metabolic Panel: Recent Labs  Lab 08/05/18 2000 08/06/18 0430 08/06/18 0858 08/06/18 1546 08/07/18 0442 08/08/18 0341  NA 136 141  --  138 141 140  K 3.8 4.4  --  4.5 3.7 3.6  CL 102 103  --  99 101 98  CO2 22 27  --  29 31 34*  GLUCOSE 156* 129*  --  131* 101* 101*  BUN 55* 43*  --  34* 33* 33*  CREATININE 3.19* 2.34*  --  1.99* 1.79* 1.68*  CALCIUM 8.4* 8.5*  --  8.0* 8.2* 8.0*  MG  --   --  2.3  --  1.9 1.8  PHOS 3.7 3.4  --  3.2 3.0  3.0 2.5    Liver Function Tests: Recent Labs  Lab 08/05/18 1257 08/05/18 2000 08/06/18 0430 08/06/18 1546 08/07/18 0442 08/08/18 0341  AST 21  --   --   --   --   --   ALT 22  --   --   --   --   --   ALKPHOS 60  --   --   --   --   --   BILITOT 0.6  --   --   --   --   --   PROT 5.8*  --   --   --   --   --   ALBUMIN 3.0* 3.1* 3.1* 2.8* 2.9* 2.8*   No results for input(s): LIPASE, AMYLASE in the  last 168 hours. No results for input(s): AMMONIA in the last 168 hours.  CBC: Recent Labs  Lab 08/05/18 1257 08/07/18 0442 08/08/18 0341  WBC 16.3* 14.1* 11.9*  NEUTROABS 14.0*  --   --   HGB 11.6* 12.1* 13.1  HCT 39.1 39.8 42.0  MCV 100.8* 96.8 95.5  PLT 189 164 172    Cardiac Enzymes: Recent Labs  Lab 08/05/18 1534  CKTOTAL 25*    BNP: Invalid input(s): POCBNP  CBG: Recent Labs  Lab 08/06/18 1520 08/06/18 2313 08/07/18 0320 08/07/18 0749 08/07/18 1125  GLUCAP 144* 95 96 130* 125*    Microbiology: Results for orders placed or performed during the hospital encounter of 08/05/18  MRSA PCR Screening     Status: None   Collection Time: 08/05/18 11:33 AM  Result Value Ref Range Status   MRSA by  PCR NEGATIVE NEGATIVE Final    Comment:        The GeneXpert MRSA Assay (FDA approved for NASAL specimens only), is one component of a comprehensive MRSA colonization surveillance program. It is not intended to diagnose MRSA infection nor to guide or monitor treatment for MRSA infections. Performed at [facility], [address] [place], [place] [address]     Coagulation Studies: No results for input(s): LABPROT, INR in the last 72 hours.  Urinalysis: Recent Labs    08/05/18 2003  COLORURINE YELLOW  LABSPEC 1.008  PHURINE 5.0  GLUCOSEU NEGATIVE  HGBUR MODERATE*  BILIRUBINUR NEGATIVE  KETONESUR NEGATIVE  PROTEINUR NEGATIVE  NITRITE NEGATIVE  LEUKOCYTESUR TRACE*      Imaging: Ct Head Wo Contrast  Result Date: 08/07/2018 CLINICAL DATA:  Confusion and ataxia. Head trauma. History of hypertension, encephalopathy. EXAM: CT HEAD WITHOUT CONTRAST TECHNIQUE: Contiguous axial images were obtained from the base of the skull through the vertex without intravenous contrast. COMPARISON:  None. FINDINGS: BRAIN: No intraparenchymal hemorrhage, mass effect nor midline shift. The ventricles and sulci are normal for age. Patchy to confluent supratentorial white  matter hypodensities. 1 cm hypodensity RIGHT inferior basal ganglia. No acute large vascular territory infarcts. No abnormal extra-axial fluid collections. Basal cisterns are patent. VASCULAR: Moderate calcific atherosclerosis of the carotid siphons. SKULL: No skull fracture. No significant scalp soft tissue swelling. SINUSES/ORBITS: Trace paranasal sinus mucosal thickening. Mastoid air cells are well aerated.The included ocular globes and orbital contents are non-suspicious. Status post LEFT ocular lens implant. OTHER: None. IMPRESSION: 1. No acute intracranial process. 2. Moderate to severe chronic small vessel ischemic changes. RIGHT basal ganglia lacunar infarct versus perivascular space. Electronically Signed   By: Awilda Metro M.D.   On: 08/07/2018 00:21   Dg Chest Port 1 View  Result Date: 08/07/2018 CLINICAL DATA:  Community acquired pneumonia EXAM: PORTABLE CHEST 1 VIEW COMPARISON:  08/05/2018 FINDINGS: Right Vas-Cath remains in place, unchanged. Cardiomegaly. Bibasilar atelectasis. Mild vascular congestion. No visible effusions or acute bony abnormality. IMPRESSION: Cardiomegaly with vascular congestion.  Bibasilar atelectasis. Electronically Signed   By: Charlett Nose M.D.   On: 08/07/2018 07:22     Medications:   . cefTRIAXone (ROCEPHIN)  IV 2 g (08/08/18 1316)   . aspirin  81 mg Oral Daily  . Chlorhexidine Gluconate Cloth  6 each Topical Q0600  . furosemide  80 mg Intravenous Q12H  . heparin  5,000 Units Subcutaneous Q8H  . mouth rinse  15 mL Mouth Rinse BID  . metoprolol tartrate  25 mg Oral BID  . sodium chloride flush  10-40 mL Intracatheter Q12H   ipratropium, levalbuterol, metoprolol tartrate, sodium chloride flush  Assessment/ Plan:   AKI, severe, nonoliguric:  Was started on CRRT on presentation with hyperkalemia, metabolic acidosis.  Was taking ARB outpatient, several weeks of unsteadiness.  Renal US ok, CK 25. UA on admission +RBC, WBC ? Foley related.  Would repeat  once foley out for a few days.  No proteinuria.  Creatinine improved to 1.68 today.  Remove trialysis catheter if OK with hospitalist.  If renal function normalizes may not need nephrology f/u - CTM.   HTN normotensive currently - hold ARB  Hyperkalemia resolved with CRRT. K 3.6 this AM. Continue to monitor.   Metabolic acidosis resolved with CRRT, CTM  Hypervolemia resolved with IV diuretic and CRRT.   Atrial fibrillation rate controlled now on Metoprolol 25 BID.  Cardiology rec DOAC renally dosed = he's had several recent  falls and would favor making sure he is stable prior to initiation.  If desired could use eliquis and his dosing is 2.5 BID for now (Cr > 1.5, age > 92).    Congestive heart failure 2D echo yesterday showed EF 60-65%  Possible pneumonia empiric Rocephin per primary team. He does say has had cough for a few weeks.   Altered mental status appears improved.  CT with small vessel dz and right basal ganglia lacunar infarct versus perivascular space.    LOS: 3 [place]ill Bakes A @TODAY @1 :38 PM

## 2018-08-09 DIAGNOSIS — J189 Pneumonia, unspecified organism: Secondary | ICD-10-CM

## 2018-08-09 LAB — HEMOGLOBIN A1C
Hgb A1c MFr Bld: 6.1 % — ABNORMAL HIGH (ref 4.8–5.6)
Mean Plasma Glucose: 128 mg/dL

## 2018-08-09 LAB — PROCALCITONIN: PROCALCITONIN: 0.1 ng/mL

## 2018-08-09 LAB — URINALYSIS, ROUTINE W REFLEX MICROSCOPIC
BILIRUBIN URINE: NEGATIVE
Bacteria, UA: NONE SEEN
GLUCOSE, UA: NEGATIVE mg/dL
Ketones, ur: NEGATIVE mg/dL
LEUKOCYTES UA: NEGATIVE
NITRITE: NEGATIVE
Protein, ur: 100 mg/dL — AB
SPECIFIC GRAVITY, URINE: 1.011 (ref 1.005–1.030)
pH: 5 (ref 5.0–8.0)

## 2018-08-09 LAB — BASIC METABOLIC PANEL
ANION GAP: 18 — AB (ref 5–15)
BUN: 39 mg/dL — ABNORMAL HIGH (ref 8–23)
CALCIUM: 8.8 mg/dL — AB (ref 8.9–10.3)
CO2: 34 mmol/L — AB (ref 22–32)
Chloride: 88 mmol/L — ABNORMAL LOW (ref 98–111)
Creatinine, Ser: 1.75 mg/dL — ABNORMAL HIGH (ref 0.61–1.24)
GFR, EST AFRICAN AMERICAN: 40 mL/min — AB (ref >60)
GFR, EST NON AFRICAN AMERICAN: 34 mL/min — AB (ref >60)
Glucose, Bld: 125 mg/dL — ABNORMAL HIGH (ref 70–99)
Potassium: 3.4 mmol/L — ABNORMAL LOW (ref 3.5–5.1)
SODIUM: 140 mmol/L (ref 135–145)

## 2018-08-09 MED ORDER — METOPROLOL TARTRATE 50 MG PO TABS
50.0000 mg | ORAL_TABLET | Freq: Two times a day (BID) | ORAL | Status: DC
  Administered 2018-08-09: 50 mg via ORAL
  Filled 2018-08-09 (×2): qty 1

## 2018-08-09 MED ORDER — ALPRAZOLAM 0.5 MG PO TABS
1.0000 mg | ORAL_TABLET | Freq: Two times a day (BID) | ORAL | Status: DC | PRN
  Administered 2018-08-09: 1 mg via ORAL
  Filled 2018-08-09: qty 2

## 2018-08-09 MED ORDER — METOPROLOL TARTRATE 50 MG PO TABS: 50.0000 mg | ORAL_TABLET | Freq: Two times a day (BID) | ORAL | Status: DC

## 2018-08-09 MED ORDER — POTASSIUM CHLORIDE CRYS ER 20 MEQ PO TBCR
20.0000 meq | EXTENDED_RELEASE_TABLET | Freq: Two times a day (BID) | ORAL | Status: AC
  Administered 2018-08-09 (×2): 20 meq via ORAL
  Filled 2018-08-09 (×2): qty 1

## 2018-08-09 MED ORDER — APIXABAN 2.5 MG PO TABS
2.5000 mg | ORAL_TABLET | Freq: Two times a day (BID) | ORAL | Status: DC
  Administered 2018-08-09: 2.5 mg via ORAL
  Filled 2018-08-09 (×3): qty 1

## 2018-08-09 NOTE — Progress Notes (Signed)
[facility] ROUNDING NOTE   Subjective:   Feels well, discharge planned for tomorrow.      Objective:  Vital signs in last 24 hours:  Temp:  [98 F (36.7 C)-98.1 F (36.7 C)] 98 F (36.7 C) (10/02 0434) Pulse Rate:  [89-97] 97 (10/02 0822) Resp:  [16-18] 18 (10/02 0434) BP: (142-176)/(67-110) 176/110 (10/02 0822) SpO2:  [95 %-99 %] 96 % (10/02 0822)  Weight change:  Filed Weights   08/06/18 0500 08/07/18 0324 08/08/18 0356  Weight: 98.2 kg 94.9 kg 94.4 kg    Intake/Output: I/O last 3 completed shifts: In: 1060 [P.O.:580; I.V.:30; Other:450] Out: 1400 [Urine:1400]   Intake/Output this shift:  Total I/O In: 10 [I.V.:10] Out: 100 [Urine:100]  Gen - lying comfortably at 30 degrees CVS- Reg rate, irreg, II/VI s murmur RS- normal WOB, on 2L [place] ABD- BS present soft non-distended EXT- no edema Neuro - oriented, conversant   Basic Metabolic Panel: Recent Labs  Lab 08/05/18 2000 08/06/18 0430 08/06/18 0858 08/06/18 1546 08/07/18 0442 08/08/18 0341 08/09/18 0750  NA 136 141  --  138 141 140 140  K 3.8 4.4  --  4.5 3.7 3.6 3.4*  CL 102 103  --  99 101 98 88*  CO2 22 27  --  29 31 34* 34*  GLUCOSE 156* 129*  --  131* 101* 101* 125*  BUN 55* 43*  --  34* 33* 33* 39*  CREATININE 3.19* 2.34*  --  1.99* 1.79* 1.68* 1.75*  CALCIUM 8.4* 8.5*  --  8.0* 8.2* 8.0* 8.8*  MG  --   --  2.3  --  1.9 1.8  --   PHOS 3.7 3.4  --  3.2 3.0  3.0 2.5  --     Liver Function Tests: Recent Labs  Lab 08/05/18 1257 08/05/18 2000 08/06/18 0430 08/06/18 1546 08/07/18 0442 08/08/18 0341  AST 21  --   --   --   --   --   ALT 22  --   --   --   --   --   ALKPHOS 60  --   --   --   --   --   BILITOT 0.6  --   --   --   --   --   PROT 5.8*  --   --   --   --   --   ALBUMIN 3.0* 3.1* 3.1* 2.8* 2.9* 2.8*   No results for input(s): LIPASE, AMYLASE in the last 168 hours. No results for input(s): AMMONIA in the last 168 hours.  CBC: Recent Labs  Lab 08/05/18 1257  08/07/18 0442 08/08/18 0341  WBC 16.3* 14.1* 11.9*  NEUTROABS 14.0*  --   --   HGB 11.6* 12.1* 13.1  HCT 39.1 39.8 42.0  MCV 100.8* 96.8 95.5  PLT 189 164 172    Cardiac Enzymes: Recent Labs  Lab 08/05/18 1534  CKTOTAL 25*    BNP: Invalid input(s): POCBNP  CBG: Recent Labs  Lab 08/06/18 1520 08/06/18 2313 08/07/18 0320 08/07/18 0749 08/07/18 1125  GLUCAP 144* 95 96 130* 125*    Microbiology: Results for orders placed or performed during the hospital encounter of 08/05/18  MRSA PCR Screening     Status: None   Collection Time: 08/05/18 11:33 AM  Result Value Ref Range Status   MRSA by PCR NEGATIVE NEGATIVE Final    Comment:        The GeneXpert MRSA Assay (FDA approved for  NASAL specimens only), is one component of a comprehensive MRSA colonization surveillance program. It is not intended to diagnose MRSA infection nor to guide or monitor treatment for MRSA infections. Performed at [facility], [address], [place], [place] [address]     Coagulation Studies: No results for input(s): LABPROT, INR in the last 72 hours.  Urinalysis: No results for input(s): COLORURINE, LABSPEC, PHURINE, GLUCOSEU, HGBUR, BILIRUBINUR, KETONESUR, PROTEINUR, UROBILINOGEN, NITRITE, LEUKOCYTESUR in the last 72 hours.  Invalid input(s): APPERANCEUR    Imaging: No results found.   Medications:   . cefTRIAXone (ROCEPHIN)  IV 2 g (08/08/18 1316)   . aspirin  81 mg Oral Daily  . Chlorhexidine Gluconate Cloth  6 each Topical Q0600  . furosemide  80 mg Intravenous Q12H  . heparin  5,000 Units Subcutaneous Q8H  . mouth rinse  15 mL Mouth Rinse BID  . metoprolol tartrate  50 mg Oral BID  . sodium chloride flush  10-40 mL Intracatheter Q12H   ipratropium, levalbuterol, metoprolol tartrate, sodium chloride flush  Assessment/ Plan:   AKI, severe, nonoliguric:  Was started on CRRT on presentation with hyperkalemia, metabolic acidosis.  Was taking ARB outpatient,  several weeks of unsteadiness.  Renal US ok, CK 25. UA on admission +RBC, WBC ? Foley related.  Repeat UA today.  No proteinuria.  Creatinine improved remarkably and stable today at 1.75.  Trialysis catheter removed.  Should have renal function checked at f/u with PCP after discharge.  If stable he likely does not need to see nephrology in follow up.   Hematuria:  On initial UA, +WBC as well. Question if foley related.  Recheck UA today.   HTN initially hypotensive but BP normalized.  Just started on metoprolol (mainly for a fib).  If requires additional agent would avoid ARB for now in the acute setting.  Could consider amlodipine.   Hyperkalemia resolved with CRRT and now hypokalemic - supplemented  Metabolic acidosis resolved with CRRT, CTM  Hypervolemia resolved with IV diuretic and CRRT.   Atrial fibrillation rate controlled now on Metoprolol 25 BID.  Cardiology rec DOAC renally dosed = he's had several recent falls and would favor making sure he is stable prior to initiation.  If desired could use eliquis and his dosing is 2.5 BID for now (Cr > 1.5, age > 11).    Congestive heart failure 2D echo yesterday showed EF 60-65%  Possible pneumonia empiric Rocephin per primary team. He does say has had cough for a few weeks.   Altered mental status appears improved.  CT with small vessel dz and right basal ganglia lacunar infarct versus perivascular space.    LOS: 4 [place] Pita @TODAY @12 :14 PM

## 2018-08-09 NOTE — Progress Notes (Signed)
PROGRESS NOTE        PATIENT DETAILS Name: Isaiah Brady Age: 84 y.o. Sex: male Date of Birth: Jan 11, 1933 Admit Date: 08/05/2018 Admitting Physician Ileana Roup, MD WUJ:[id number], Lucita Lora, MD  Brief Narrative: Patient is a 84 y.o. male with history of hypertension, peripheral neuropathy who presented with severe weakness and confusion, was found to have acute kidney injury and hyperkalemia.  Underwent emergent renal replacement therapy, hospital course subsequently complicated by A. fib with RVR.  Subjective: Awake and alert-no chest pain or shortness of breath.  Assessment/Plan: Acute kidney injury: Etiology unknown but thought to be probably hemodynamically mediated-was on losartan at home.  Was started on CRRT on presentation due to hyperkalemia and metabolic acidosis.  Creatinine has improved remarkably-HD catheter and Foley catheter have been removed.  Nephrology recommends repeat chemistry panel with PCP in the next week or so after discharge.  Hyperkalemia: Secondary to AKI and losartan use.  Resolved  Metabolic acidosis: Secondary to [facility] with CRRT.  Acute metabolic encephalopathy: Secondary to AKI-resolved.  Acute hypoxic respiratory failure: Thought to be secondary to volume overload in the setting of renal failure, due to concern for possible community-acquired pneumonia-started on Rocephin-has completed a 5-day course-does not require any further antimicrobial therapy.  Remains on IV Lasix-we will discuss with nephrology prior to discharge-regarding discharge diuretic recommendation.  PAF: Rate overall better-overnight did have a few episodes of A. fib with RVR-currently in sinus rhythm this morning.  Increase metoprolol to 50 mg twice daily, continue Eliquis.  Will monitor overnight-if no further issues with the rate control-home on 10/3.  Moderate tricuspid regurgitation: Probably secondary to moderate pulmonary hypertension-stable for  outpatient follow-up  Pulmonary hypertension: Stable for outpatient follow-up-PA pressure by TTE was approximately 61.  Suspect this is a chronic issue.  DVT Prophylaxis: Full dose anticoagulation with Eliquis  Code Status: Full code   Family Communication: None at bedside  Disposition Plan: Remain inpatient-but will plan on Home health-hopefully discharge on 10/3  Antimicrobial agents: Anti-infectives (From admission, onward)   Start     Dose/Rate Route Frequency Ordered Stop   08/05/18 1400  cefTRIAXone (ROCEPHIN) 2 g in sodium chloride 0.9 % 100 mL IVPB     2 g 200 mL/hr over 30 Minutes Intravenous Every 24 hours 08/05/18 1356 08/09/18 1402      Procedures: None  CONSULTS:  cardiology and nephrology  Time spent: 25- minutes-Greater than 50% of this time was spent in counseling, explanation of diagnosis, planning of further management, and coordination of care.  MEDICATIONS: Scheduled Meds: . aspirin  81 mg Oral Daily  . Chlorhexidine Gluconate Cloth  6 each Topical Q0600  . furosemide  80 mg Intravenous Q12H  . heparin  5,000 Units Subcutaneous Q8H  . mouth rinse  15 mL Mouth Rinse BID  . metoprolol tartrate  50 mg Oral BID  . potassium chloride  20 mEq Oral BID  . sodium chloride flush  10-40 mL Intracatheter Q12H   Continuous Infusions: PRN Meds:.ipratropium, levalbuterol, metoprolol tartrate, sodium chloride flush   PHYSICAL EXAM: Vital signs: Vitals:   08/08/18 2143 08/09/18 0434 08/09/18 0822 08/09/18 1455  BP: (!) 142/67 (!) 148/86 (!) 176/110 (!) 160/74  Pulse: 90 94 97 92  Resp: 16 18  16   Temp: 98.1 F (36.7 C) 98 F (36.7 C)  98 F (36.7 C)  TempSrc: Oral  Oral  Oral  SpO2: 99% 98% 96% 95%  Weight:      Height:       Filed Weights   08/06/18 0500 08/07/18 0324 08/08/18 0356  Weight: 98.2 kg 94.9 kg 94.4 kg   Body mass index is 29.86 kg/m.   General appearance :Awake, alert, not in any distress. Eyes:Pink conjunctiva HEENT:  Atraumatic and Normocephalic Neck: supple Resp:Good air entry bilaterally, no added sounds  CVS: S1 S2 regular, no murmurs.  GI: Bowel sounds present, Non tender and not distended with no gaurding, rigidity or rebound. Extremities: B/L Lower Ext shows no edema, both legs are warm to touch Neurology:  speech clear,Non focal, sensation is grossly intact. Psychiatric: Normal judgment and insight. Alert and oriented x 3. Normal mood. Musculoskeletal:No digital cyanosis Skin:No Rash, warm and dry Wounds:N/A  I have personally reviewed following labs and imaging studies  LABORATORY DATA: CBC: Recent Labs  Lab 08/05/18 1257 08/07/18 0442 08/08/18 0341  WBC 16.3* 14.1* 11.9*  NEUTROABS 14.0*  --   --   HGB 11.6* 12.1* 13.1  HCT 39.1 39.8 42.0  MCV 100.8* 96.8 95.5  PLT 189 164 172    Basic Metabolic Panel: Recent Labs  Lab 08/05/18 2000 08/06/18 0430 08/06/18 0858 08/06/18 1546 08/07/18 0442 08/08/18 0341 08/09/18 0750  NA 136 141  --  138 141 140 140  K 3.8 4.4  --  4.5 3.7 3.6 3.4*  CL 102 103  --  99 101 98 88*  CO2 22 27  --  29 31 34* 34*  GLUCOSE 156* 129*  --  131* 101* 101* 125*  BUN 55* 43*  --  34* 33* 33* 39*  CREATININE 3.19* 2.34*  --  1.99* 1.79* 1.68* 1.75*  CALCIUM 8.4* 8.5*  --  8.0* 8.2* 8.0* 8.8*  MG  --   --  2.3  --  1.9 1.8  --   PHOS 3.7 3.4  --  3.2 3.0  3.0 2.5  --     GFR: Estimated Creatinine Clearance: 36.9 mL/min (A) (by C-G formula based on SCr of 1.75 mg/dL (H)).  Liver Function Tests: Recent Labs  Lab 08/05/18 1257 08/05/18 2000 08/06/18 0430 08/06/18 1546 08/07/18 0442 08/08/18 0341  AST 21  --   --   --   --   --   ALT 22  --   --   --   --   --   ALKPHOS 60  --   --   --   --   --   BILITOT 0.6  --   --   --   --   --   PROT 5.8*  --   --   --   --   --   ALBUMIN 3.0* 3.1* 3.1* 2.8* 2.9* 2.8*   No results for input(s): LIPASE, AMYLASE in the last 168 hours. No results for input(s): AMMONIA in the last 168  hours.  Coagulation Profile: Recent Labs  Lab 08/05/18 1257  INR 1.20    Cardiac Enzymes: Recent Labs  Lab 08/05/18 1534  CKTOTAL 25*    BNP (last 3 results) No results for input(s): PROBNP in the last 8760 hours.  HbA1C: Recent Labs    08/08/18 0341  HGBA1C 6.1*    CBG: Recent Labs  Lab 08/06/18 1520 08/06/18 2313 08/07/18 0320 08/07/18 0749 08/07/18 1125  GLUCAP 144* 95 96 130* 125*    Lipid Profile: Recent Labs    08/08/18 0341  CHOL 143  HDL 38*  LDLCALC 60  TRIG 161*  CHOLHDL 3.8    Thyroid Function Tests: No results for input(s): TSH, T4TOTAL, FREET4, T3FREE, THYROIDAB in the last 72 hours.  Anemia Panel: No results for input(s): VITAMINB12, FOLATE, FERRITIN, TIBC, IRON, RETICCTPCT in the last 72 hours.  Urine analysis:    Component Value Date/Time   COLORURINE YELLOW 08/09/2018 1315   APPEARANCEUR CLEAR 08/09/2018 1315   LABSPEC 1.011 08/09/2018 1315   PHURINE 5.0 08/09/2018 1315   GLUCOSEU NEGATIVE 08/09/2018 1315   HGBUR SMALL (A) 08/09/2018 1315   BILIRUBINUR NEGATIVE 08/09/2018 1315   KETONESUR NEGATIVE 08/09/2018 1315   PROTEINUR 100 (A) 08/09/2018 1315   NITRITE NEGATIVE 08/09/2018 1315   LEUKOCYTESUR NEGATIVE 08/09/2018 1315    Sepsis Labs: Lactic Acid, Venous    Component Value Date/Time   LATICACIDVEN 2.8 (HH) 08/05/2018 1257    MICROBIOLOGY: Recent Results (from the past 240 hour(s))  MRSA PCR Screening     Status: None   Collection Time: 08/05/18 11:33 AM  Result Value Ref Range Status   MRSA by PCR NEGATIVE NEGATIVE Final    Comment:        The GeneXpert MRSA Assay (FDA approved for NASAL specimens only), is one component of a comprehensive MRSA colonization surveillance program. It is not intended to diagnose MRSA infection nor to guide or monitor treatment for MRSA infections. Performed at [facility], [address], [place], [place] [address]     RADIOLOGY STUDIES/RESULTS: Ct Head Wo  Contrast  Result Date: 08/07/2018 CLINICAL DATA:  Confusion and ataxia. Head trauma. History of hypertension, encephalopathy. EXAM: CT HEAD WITHOUT CONTRAST TECHNIQUE: Contiguous axial images were obtained from the base of the skull through the vertex without intravenous contrast. COMPARISON:  None. FINDINGS: BRAIN: No intraparenchymal hemorrhage, mass effect nor midline shift. The ventricles and sulci are normal for age. Patchy to confluent supratentorial white matter hypodensities. 1 cm hypodensity RIGHT inferior basal ganglia. No acute large vascular territory infarcts. No abnormal extra-axial fluid collections. Basal cisterns are patent. VASCULAR: Moderate calcific atherosclerosis of the carotid siphons. SKULL: No skull fracture. No significant scalp soft tissue swelling. SINUSES/ORBITS: Trace paranasal sinus mucosal thickening. Mastoid air cells are well aerated.The included ocular globes and orbital contents are non-suspicious. Status post LEFT ocular lens implant. OTHER: None. IMPRESSION: 1. No acute intracranial process. 2. Moderate to severe chronic small vessel ischemic changes. RIGHT basal ganglia lacunar infarct versus perivascular space. Electronically Signed   By: Awilda Metro M.D.   On: 08/07/2018 00:21   US Renal  Result Date: 08/06/2018 CLINICAL DATA:  Acute kidney injury EXAM: RENAL / URINARY TRACT ULTRASOUND COMPLETE COMPARISON:  None. FINDINGS: Right Kidney: Length: 12.9 cm. Echogenic renal parenchyma. Cortical thinning. No mass or hydronephrosis. Left Kidney: Length: 10.8 cm. Echogenic renal parenchyma. Cortical thinning/atrophy. No mass or hydronephrosis. Bladder: Not discretely visualized/underdistended. IMPRESSION: Bilateral renal cortical thinning/atrophy, left greater than right. Echogenic renal parenchyma, suggesting medical renal disease. No hydronephrosis. Bladder is not discretely visualized/underdistended. Electronically Signed   By: Charline Bills M.D.   On: 08/06/2018  01:59   Dg Chest Port 1 View  Result Date: 08/07/2018 CLINICAL DATA:  Community acquired pneumonia EXAM: PORTABLE CHEST 1 VIEW COMPARISON:  08/05/2018 FINDINGS: Right Vas-Cath remains in place, unchanged. Cardiomegaly. Bibasilar atelectasis. Mild vascular congestion. No visible effusions or acute bony abnormality. IMPRESSION: Cardiomegaly with vascular congestion.  Bibasilar atelectasis. Electronically Signed   By: Charlett Nose M.D.   On: 08/07/2018 07:22   Dg Chest [facility]  1 View  Result Date: 08/05/2018 CLINICAL DATA:  Central venous catheter placement EXAM: PORTABLE CHEST 1 VIEW COMPARISON:  08/05/2018, 08/04/2018 FINDINGS: Placement of right-sided central venous catheter with tip superimposing the jugular brachiocephalic confluence. No right pneumothorax. Continued cardiomegaly with streaky atelectasis or infiltrate at the left base, slightly improved. IMPRESSION: 1. Right central venous catheter tip overlies the jugular/brachiocephalic confluence. There is no right pneumothorax 2. Cardiomegaly. Streaky atelectasis or infiltrate at the left base with some improvement in aeration. Electronically Signed   By: Jasmine Pang M.D.   On: 08/05/2018 21:53   Dg Chest Port 1 View  Result Date: 08/05/2018 CLINICAL DATA:  Shortness of breath EXAM: PORTABLE CHEST 1 VIEW COMPARISON:  August 04, 2018 FINDINGS: There is airspace consolidation in the left base with small left pleural effusion. Lungs elsewhere are clear. Heart is mildly enlarged with pulmonary vascularity normal. No adenopathy. There is aortic atherosclerosis. No evident bone lesions. IMPRESSION: Airspace consolidation left base consistent with pneumonia. Small left pleural effusion. Lungs elsewhere clear. Stable cardiomegaly. There is aortic atherosclerosis. Aortic Atherosclerosis (ICD10-I70.0). Electronically Signed   By: Bretta Bang III M.D.   On: 08/05/2018 14:13     LOS: 4 days   Jeoffrey Massed, MD  [facility]  If 7PM-7AM,  please contact night-coverage  Please page via [email or web address]-Password [email or web address]-click on MD name and type text message  08/09/2018, 3:32 PM

## 2018-08-09 NOTE — Progress Notes (Signed)
ANTICOAGULATION CONSULT NOTE - Initial Consult  Pharmacy Consult for apixaban Indication: atrial fibrillation  No Known Allergies  Patient Measurements: Height: 5\' 10"  (177.8 cm) Weight: 208 lb 1.8 oz (94.4 kg) IBW/kg (Calculated) : 73 Heparin Dosing Weight:   Vital Signs: Temp: 98 F (36.7 C) (10/02 1455) Temp Source: Oral (10/02 1455) BP: 160/74 (10/02 1455) Pulse Rate: 92 (10/02 1455)  Labs: Recent Labs    08/07/18 0442 08/08/18 0341 08/09/18 0750  HGB 12.1* 13.1  --   HCT 39.8 42.0  --   PLT 164 172  --   APTT 32 32  --   CREATININE 1.79* 1.68* 1.75*    Estimated Creatinine Clearance: 36.9 mL/min (A) (by C-G formula based on SCr of 1.75 mg/dL (H)).   Medical History: Past Medical History:  Diagnosis Date  . COPD (chronic obstructive pulmonary disease) (HCC)   . Hypertension   . Peripheral neuropathy     Medications:  Scheduled:  . Chlorhexidine Gluconate Cloth  6 each Topical Q0600  . furosemide  80 mg Intravenous Q12H  . mouth rinse  15 mL Mouth Rinse BID  . metoprolol tartrate  50 mg Oral BID  . potassium chloride  20 mEq Oral BID  . sodium chloride flush  10-40 mL Intracatheter Q12H   Infusions:   PRN: ipratropium, levalbuterol, metoprolol tartrate, sodium chloride flush  Assessment: 84 yo who was admitted for new afib. Plan is to use apixaban for anticoagulation. He is >24 yo, wt 95kg, scr 1.75. Hgb stable at baseline. We will use reduce dose base on factors above.   Goal of Therapy:  Monitor platelets by anticoagulation protocol: Yes   Plan:  Apixaban 2.5mg  PO BID F/u for bleeding  Ulyses Southward, PharmD, BCIDP, AAHIVP, CPP Infectious Disease Pharmacist Pager: [phone] 08/09/2018 3:50 PM

## 2018-08-09 NOTE — Progress Notes (Signed)
Physical Therapy Treatment Patient Details Name: Isaiah Brady MRN: [id number] DOB: Jul 02, 1933 Today's Date: 08/09/2018    History of Present Illness Pt is an 84 y.o. male admitted 08/05/18 with increasing falls; found to have AKI on CKD III. Also with acute respiratory, treated for CPAP; encephalopathy likely metabolic from uremia; new onset a-fib. CT showed now acute intracranial abnormality; chronic ischemic changes with R basal ganglia lacunar stroke. PMH includes COPD, HTN, peripheral neuropathy.   PT Comments    Pt progressing well with mobility. Able to perform ADLs, transfer, and ambulate without DME at supervision-level. SpO2 >/92% on RA throughout session. Will continue to follow acutely.  Follow Up Recommendations  Home health PT;Supervision/Assistance - 24 hour     Equipment Recommendations  None recommended by PT    Recommendations for Other Services       Precautions / Restrictions Precautions Precautions: Fall Restrictions Weight Bearing Restrictions: No    Mobility  Bed Mobility Overal bed mobility: Modified Independent             General bed mobility comments: HOB elevated. When asked if pt would try from flat bed, he reports "I have sleep number bed at home that raises up"  Transfers Overall transfer level: Needs assistance Equipment used: None Transfers: Sit to/from Stand Sit to Stand: Supervision            Ambulation/Gait Ambulation/Gait assistance: Supervision Gait Distance (Feet): 350 Feet Assistive device: None Gait Pattern/deviations: Step-through pattern;Decreased stride length Gait velocity: Decreased Gait velocity interpretation: 1.31 - 2.62 ft/sec, indicative of limited community ambulator General Gait Details: Slow, steady amb without DME; initial min guard for balance progressing to supervision. SpO2 92-96% on RA throughout   Stairs             Wheelchair Mobility    Modified Rankin (Stroke Patients Only)        Balance Overall balance assessment: Needs assistance   Sitting balance-[name] Scale: Good       Standing balance-[name] Scale: Good Standing balance comment: Able to static stand at sink and perform ADL tasks without UE support                            Cognition Arousal/Alertness: Awake/alert Behavior During Therapy: WFL for tasks assessed/performed Overall Cognitive Status: Within Functional Limits for tasks assessed                                        Exercises      General Comments        Pertinent Vitals/Pain Pain Assessment: No/denies pain    Home Living                      Prior Function            PT Goals (current goals can now be found in the care plan section) Acute Rehab PT Goals Patient Stated Goal: Return home PT Goal Formulation: With patient Time For Goal Achievement: 08/22/18 Potential to Achieve Goals: Good Progress towards PT goals: Progressing toward goals    Frequency    Min 3X/week      PT Plan Current plan remains appropriate    Co-evaluation              AM-PAC PT "6 Clicks" Daily Activity  Outcome Measure  Difficulty turning over  in bed (including adjusting bedclothes, sheets and blankets)?: None Difficulty moving from lying on back to sitting on the side of the bed? : None Difficulty sitting down on and standing up from a chair with arms (e.g., wheelchair, bedside commode, etc,.)?: A Little Help needed moving to and from a bed to chair (including a wheelchair)?: A Little Help needed walking in hospital room?: A Little Help needed climbing 3-5 steps with a railing? : A Little 6 Click Score: 20    End of Session Equipment Utilized During Treatment: Gait belt Activity Tolerance: Patient tolerated treatment well Patient left: in chair;with call bell/phone within reach;with chair alarm set Nurse Communication: Mobility status PT Visit Diagnosis: Other abnormalities of gait and  mobility (R26.89)     Time: 1610-9604 PT Time Calculation (min) (ACUTE ONLY): 20 min  Charges:  $Gait Training: 8-22 mins                    Ina Homes, PT, DPT Acute Rehabilitation Services  Pager [phone] Office [phone]  Malachy Chamber 08/09/2018, 10:07 AM

## 2018-08-10 ENCOUNTER — Encounter (HOSPITAL_COMMUNITY): Payer: Self-pay

## 2018-08-10 MED ORDER — APIXABAN 2.5 MG PO TABS: 2.5000 mg | ORAL_TABLET | Freq: Two times a day (BID) | ORAL | 0 refills | Status: DC

## 2018-08-10 MED ORDER — METOPROLOL TARTRATE 50 MG PO TABS: 50.0000 mg | ORAL_TABLET | Freq: Two times a day (BID) | ORAL | 0 refills | Status: DC

## 2018-08-10 MED FILL — METOPROLOL TARTRATE 50 MG T: 50 | 30 days supply | Qty: 60 | Fill #0

## 2018-08-10 NOTE — Discharge Summary (Signed)
PATIENT DETAILS Name: Isaiah Brady Age: 84 y.o. Sex: male Date of Birth: 31-Mar-1933 MRN: [id number]. Admitting Physician: Ileana Roup, MD [id number], Lucita Lora, MD  Admit Date: 08/05/2018 Discharge date: 08/10/2018  Recommendations for Outpatient Follow-up:  1. Follow up with PCP in 1-2 weeks 2. Please obtain BMP/CBC in one week  3. Please ensure follow-up with cardiology 4. Please ensure follow-up with nephrology  Admitted From:  Home  Disposition: Home with home health services   Home Health: Yes  Equipment/Devices: None  Discharge Condition: Stable  CODE STATUS: FULL CODE  Diet recommendation:  Heart Healthy   Brief Summary: See H&P, Labs, Consult and Test reports for all details in brief, Patient is a 84 y.o. male with history of hypertension, peripheral neuropathy who presented with severe weakness and confusion, was found to have acute kidney injury and hyperkalemia.  Underwent emergent renal replacement therapy, hospital course subsequently complicated by A. fib with RVR.  Brief Hospital Course: Acute kidney injury: Etiology unknown but thought to be probably hemodynamically mediated-was on losartan at home.  Was started on CRRT on presentation due to hyperkalemia and metabolic acidosis.  Creatinine has improved remarkably-HD catheter and Foley catheter have been removed.  Nephrology recommends repeat chemistry panel with PCP in the next week or so after discharge.  Hyperkalemia: Secondary to AKI and losartan use.  Resolved  Metabolic acidosis: Secondary to [facility] with CRRT.  Acute metabolic encephalopathy: Secondary to AKI-resolved.  Acute hypoxic respiratory failure: Thought to be secondary to volume overload in the setting of renal failure, due to concern for possible community-acquired pneumonia-started on Rocephin-has completed a 5-day course-does not require any further antimicrobial therapy.  Spoke with nephrology today-does not require  Lasix on discharge.  PAF:  Rate much better overnight-no further episodes of RVR-stable to discharge with metoprolol 50 mg twice daily.  Has been started on Eliquis.  Has outpatient follow-up with cardiology on 10/22-he has been encouraged to keep this appointment.   Moderate tricuspid regurgitation: Probably secondary to moderate pulmonary hypertension-stable for outpatient follow-up has outpatient appointment with cardiology on 10/22.  Pulmonary hypertension: Stable for outpatient follow-up-PA pressure by TTE was approximately 61.  Suspect this is a chronic issue.  Has outpatient appointment with cardiology on 10/22.  Procedures/Studies: Left femoral HD cath 9/28 > >9/29  Right IJ HD cath 9/28 >>  Discharge Diagnoses:  Active Problems:   AKI (acute kidney injury) (HCC)   Hyperkalemia   Encephalopathy   Pulmonary edema   Paroxysmal atrial fibrillation (HCC)   Metabolic acidosis   Acute metabolic encephalopathy   Discharge Instructions:  Activity:  As tolerated with Full fall precautions use walker/cane & assistance as needed  Discharge Instructions    Diet - low sodium heart healthy   Complete by:  As directed    Discharge instructions   Complete by:  As directed    Follow with Primary MD  Richardean Chimera, MD in 1 week  Follow with Cardiology-10/22  Please get a complete blood count and chemistry panel checked by your Primary MD at your next visit, and again as instructed by your Primary MD.  Get Medicines reviewed and adjusted: Please take all your medications with you for your next visit with your Primary MD  Laboratory/radiological data: Please request your Primary MD to go over all hospital tests and procedure/radiological results at the follow up, please ask your Primary MD to get all Hospital records sent to his/her office.  In some cases, they will be blood  work, cultures and biopsy results pending at the time of your discharge. Please request that your  primary care M.D. follows up on these results.  Also Note the following: If you experience worsening of your admission symptoms, develop shortness of breath, life threatening emergency, suicidal or homicidal thoughts you must seek medical attention immediately by calling 911 or calling your MD immediately  if symptoms less severe.  You must read complete instructions/literature along with all the possible adverse reactions/side effects for all the Medicines you take and that have been prescribed to you. Take any new Medicines after you have completely understood and accpet all the possible adverse reactions/side effects.   Do not drive when taking Pain medications or sleeping medications (Benzodaizepines)  Do not take more than prescribed Pain, Sleep and Anxiety Medications. It is not advisable to combine anxiety,sleep and pain medications without talking with your primary care practitioner  Special Instructions: If you have smoked or chewed Tobacco  in the last 2 yrs please stop smoking, stop any regular Alcohol  and or any Recreational drug use.  Wear Seat belts while driving.  Please note: You were cared for by a hospitalist during your hospital stay. Once you are discharged, your primary care physician will handle any further medical issues. Please note that NO REFILLS for any discharge medications will be authorized once you are discharged, as it is imperative that you return to your primary care physician (or establish a relationship with a primary care physician if you do not have one) for your post hospital discharge needs so that they can reassess your need for medications and monitor your lab values.   Increase activity slowly   Complete by:  As directed      Allergies as of 08/10/2018   No Known Allergies     Medication List    STOP taking these medications   aspirin 81 MG tablet   levofloxacin 500 MG tablet Commonly known as:  LEVAQUIN   losartan 100 MG tablet Commonly  known as:  COZAAR     TAKE these medications   ALPRAZolam 1 MG tablet Commonly known as:  XANAX Take 1 mg by mouth 2 (two) times daily before a meal.   amitriptyline 50 MG tablet Commonly known as:  ELAVIL Take 50 mg by mouth at bedtime.   apixaban 2.5 MG Tabs tablet Commonly known as:  ELIQUIS Take 1 tablet (2.5 mg total) by mouth 2 (two) times daily.   fish oil-omega-3 fatty acids 1000 MG capsule Take by mouth 2 (two) times daily before a meal.   FLUoxetine 20 MG capsule Commonly known as:  PROZAC Take 20 mg by mouth daily.   gabapentin 300 MG capsule Commonly known as:  NEURONTIN Take 300 mg by mouth 3 (three) times daily.   metoprolol tartrate 50 MG tablet Commonly known as:  LOPRESSOR Take 1 tablet (50 mg total) by mouth 2 (two) times daily.   omeprazole 20 MG capsule Commonly known as:  PRILOSEC Take 20 mg by mouth 2 (two) times daily before a meal.   traMADol 50 MG tablet Commonly known as:  ULTRAM Take 100 mg by mouth 4 (four) times daily.   vitamin B-12 1000 MCG tablet Commonly known as:  CYANOCOBALAMIN Take 1,000 mcg by mouth daily.      Follow-up Information    Jonelle Sidle, MD Follow up.   Specialty:  Cardiology Why:  Cardiology hospital follow-up on Tuesday, October 22 at 8:40 AM.  Please arrive 15 minutes  early for check-in Contact information: [address] [place] [address] [phone]        Richardean Chimera, MD. Schedule an appointment as soon as possible for a visit in 1 week(s).   Specialty:  Family Medicine Contact information: [address] [place] [address] [phone]          No Known Allergies  Consultations:   cardiology, pulmonary/intensive care and nephrology  Other Procedures/Studies: Ct Head Wo Contrast  Result Date: 08/07/2018 CLINICAL DATA:  Confusion and ataxia. Head trauma. History of hypertension, encephalopathy. EXAM: CT HEAD WITHOUT CONTRAST TECHNIQUE: Contiguous axial images were obtained  from the base of the skull through the vertex without intravenous contrast. COMPARISON:  None. FINDINGS: BRAIN: No intraparenchymal hemorrhage, mass effect nor midline shift. The ventricles and sulci are normal for age. Patchy to confluent supratentorial white matter hypodensities. 1 cm hypodensity RIGHT inferior basal ganglia. No acute large vascular territory infarcts. No abnormal extra-axial fluid collections. Basal cisterns are patent. VASCULAR: Moderate calcific atherosclerosis of the carotid siphons. SKULL: No skull fracture. No significant scalp soft tissue swelling. SINUSES/ORBITS: Trace paranasal sinus mucosal thickening. Mastoid air cells are well aerated.The included ocular globes and orbital contents are non-suspicious. Status post LEFT ocular lens implant. OTHER: None. IMPRESSION: 1. No acute intracranial process. 2. Moderate to severe chronic small vessel ischemic changes. RIGHT basal ganglia lacunar infarct versus perivascular space. Electronically Signed   By: Awilda Metro M.D.   On: 08/07/2018 00:21   US Renal  Result Date: 08/06/2018 CLINICAL DATA:  Acute kidney injury EXAM: RENAL / URINARY TRACT ULTRASOUND COMPLETE COMPARISON:  None. FINDINGS: Right Kidney: Length: 12.9 cm. Echogenic renal parenchyma. Cortical thinning. No mass or hydronephrosis. Left Kidney: Length: 10.8 cm. Echogenic renal parenchyma. Cortical thinning/atrophy. No mass or hydronephrosis. Bladder: Not discretely visualized/underdistended. IMPRESSION: Bilateral renal cortical thinning/atrophy, left greater than right. Echogenic renal parenchyma, suggesting medical renal disease. No hydronephrosis. Bladder is not discretely visualized/underdistended. Electronically Signed   By: Charline Bills M.D.   On: 08/06/2018 01:59   Dg Chest Port 1 View  Result Date: 08/07/2018 CLINICAL DATA:  Community acquired pneumonia EXAM: PORTABLE CHEST 1 VIEW COMPARISON:  08/05/2018 FINDINGS: Right Vas-Cath remains in place, unchanged.  Cardiomegaly. Bibasilar atelectasis. Mild vascular congestion. No visible effusions or acute bony abnormality. IMPRESSION: Cardiomegaly with vascular congestion.  Bibasilar atelectasis. Electronically Signed   By: Charlett Nose M.D.   On: 08/07/2018 07:22   Dg Chest Port 1 View  Result Date: 08/05/2018 CLINICAL DATA:  Central venous catheter placement EXAM: PORTABLE CHEST 1 VIEW COMPARISON:  08/05/2018, 08/04/2018 FINDINGS: Placement of right-sided central venous catheter with tip superimposing the jugular brachiocephalic confluence. No right pneumothorax. Continued cardiomegaly with streaky atelectasis or infiltrate at the left base, slightly improved. IMPRESSION: 1. Right central venous catheter tip overlies the jugular/brachiocephalic confluence. There is no right pneumothorax 2. Cardiomegaly. Streaky atelectasis or infiltrate at the left base with some improvement in aeration. Electronically Signed   By: Jasmine Pang M.D.   On: 08/05/2018 21:53   Dg Chest Port 1 View  Result Date: 08/05/2018 CLINICAL DATA:  Shortness of breath EXAM: PORTABLE CHEST 1 VIEW COMPARISON:  August 04, 2018 FINDINGS: There is airspace consolidation in the left base with small left pleural effusion. Lungs elsewhere are clear. Heart is mildly enlarged with pulmonary vascularity normal. No adenopathy. There is aortic atherosclerosis. No evident bone lesions. IMPRESSION: Airspace consolidation left base consistent with pneumonia. Small left pleural effusion. Lungs elsewhere clear. Stable cardiomegaly.  There is aortic atherosclerosis. Aortic Atherosclerosis (ICD10-I70.0). Electronically Signed   By: Bretta Bang III M.D.   On: 08/05/2018 14:13      TODAY-DAY OF DISCHARGE:  Subjective:   Jory Sims today has no headache,no chest abdominal pain,no new weakness tingling or numbness, feels much better wants to go home today.   Objective:   Blood pressure (!) 158/91, pulse 87, temperature 98 F (36.7 C),  temperature source Oral, resp. rate 18, height 5\' 10"  (1.778 m), weight 94.4 kg, SpO2 94 %.  Intake/Output Summary (Last 24 hours) at 08/10/2018 0811 Last data filed at 08/09/2018 2252 Gross per 24 hour  Intake 20 ml  Output 100 ml  Net -80 ml   Filed Weights   08/06/18 0500 08/07/18 0324 08/08/18 0356  Weight: 98.2 kg 94.9 kg 94.4 kg    Exam: Awake Alert, Oriented *3, No new F.N deficits, Normal affect [place].AT,PERRAL Supple Neck,No JVD, No cervical lymphadenopathy appriciated.  Symmetrical Chest wall movement, Good air movement bilaterally, CTAB RRR,No Gallops,Rubs or new Murmurs, No Parasternal Heave +ve B.Sounds, Abd Soft, Non tender, No organomegaly appriciated, No rebound -guarding or rigidity. No Cyanosis, Clubbing or edema, No new Rash or bruise   PERTINENT RADIOLOGIC STUDIES: Ct Head Wo Contrast  Result Date: 08/07/2018 CLINICAL DATA:  Confusion and ataxia. Head trauma. History of hypertension, encephalopathy. EXAM: CT HEAD WITHOUT CONTRAST TECHNIQUE: Contiguous axial images were obtained from the base of the skull through the vertex without intravenous contrast. COMPARISON:  None. FINDINGS: BRAIN: No intraparenchymal hemorrhage, mass effect nor midline shift. The ventricles and sulci are normal for age. Patchy to confluent supratentorial white matter hypodensities. 1 cm hypodensity RIGHT inferior basal ganglia. No acute large vascular territory infarcts. No abnormal extra-axial fluid collections. Basal cisterns are patent. VASCULAR: Moderate calcific atherosclerosis of the carotid siphons. SKULL: No skull fracture. No significant scalp soft tissue swelling. SINUSES/ORBITS: Trace paranasal sinus mucosal thickening. Mastoid air cells are well aerated.The included ocular globes and orbital contents are non-suspicious. Status post LEFT ocular lens implant. OTHER: None. IMPRESSION: 1. No acute intracranial process. 2. Moderate to severe chronic small vessel ischemic changes. RIGHT basal  ganglia lacunar infarct versus perivascular space. Electronically Signed   By: Awilda Metro M.D.   On: 08/07/2018 00:21   US Renal  Result Date: 08/06/2018 CLINICAL DATA:  Acute kidney injury EXAM: RENAL / URINARY TRACT ULTRASOUND COMPLETE COMPARISON:  None. FINDINGS: Right Kidney: Length: 12.9 cm. Echogenic renal parenchyma. Cortical thinning. No mass or hydronephrosis. Left Kidney: Length: 10.8 cm. Echogenic renal parenchyma. Cortical thinning/atrophy. No mass or hydronephrosis. Bladder: Not discretely visualized/underdistended. IMPRESSION: Bilateral renal cortical thinning/atrophy, left greater than right. Echogenic renal parenchyma, suggesting medical renal disease. No hydronephrosis. Bladder is not discretely visualized/underdistended. Electronically Signed   By: Charline Bills M.D.   On: 08/06/2018 01:59   Dg Chest Port 1 View  Result Date: 08/07/2018 CLINICAL DATA:  Community acquired pneumonia EXAM: PORTABLE CHEST 1 VIEW COMPARISON:  08/05/2018 FINDINGS: Right Vas-Cath remains in place, unchanged. Cardiomegaly. Bibasilar atelectasis. Mild vascular congestion. No visible effusions or acute bony abnormality. IMPRESSION: Cardiomegaly with vascular congestion.  Bibasilar atelectasis. Electronically Signed   By: Charlett Nose M.D.   On: 08/07/2018 07:22   Dg Chest Port 1 View  Result Date: 08/05/2018 CLINICAL DATA:  Central venous catheter placement EXAM: PORTABLE CHEST 1 VIEW COMPARISON:  08/05/2018, 08/04/2018 FINDINGS: Placement of right-sided central venous catheter with tip superimposing the jugular brachiocephalic confluence. No right pneumothorax. Continued cardiomegaly with streaky atelectasis or infiltrate at  the left base, slightly improved. IMPRESSION: 1. Right central venous catheter tip overlies the jugular/brachiocephalic confluence. There is no right pneumothorax 2. Cardiomegaly. Streaky atelectasis or infiltrate at the left base with some improvement in aeration. Electronically  Signed   By: Jasmine Pang M.D.   On: 08/05/2018 21:53   Dg Chest Port 1 View  Result Date: 08/05/2018 CLINICAL DATA:  Shortness of breath EXAM: PORTABLE CHEST 1 VIEW COMPARISON:  August 04, 2018 FINDINGS: There is airspace consolidation in the left base with small left pleural effusion. Lungs elsewhere are clear. Heart is mildly enlarged with pulmonary vascularity normal. No adenopathy. There is aortic atherosclerosis. No evident bone lesions. IMPRESSION: Airspace consolidation left base consistent with pneumonia. Small left pleural effusion. Lungs elsewhere clear. Stable cardiomegaly. There is aortic atherosclerosis. Aortic Atherosclerosis (ICD10-I70.0). Electronically Signed   By: Bretta Bang III M.D.   On: 08/05/2018 14:13     PERTINENT LAB RESULTS: CBC: Recent Labs    08/08/18 0341  WBC 11.9*  HGB 13.1  HCT 42.0  PLT 172   CMET CMP     Component Value Date/Time   NA 140 08/09/2018 0750   K 3.4 (L) 08/09/2018 0750   CL 88 (L) 08/09/2018 0750   CO2 34 (H) 08/09/2018 0750   GLUCOSE 125 (H) 08/09/2018 0750   BUN 39 (H) 08/09/2018 0750   CREATININE 1.75 (H) 08/09/2018 0750   CALCIUM 8.8 (L) 08/09/2018 0750   PROT 5.8 (L) 08/05/2018 1257   ALBUMIN 2.8 (L) 08/08/2018 0341   AST 21 08/05/2018 1257   ALT 22 08/05/2018 1257   ALKPHOS 60 08/05/2018 1257   BILITOT 0.6 08/05/2018 1257   GFRNONAA 34 (L) 08/09/2018 0750   GFRAA 40 (L) 08/09/2018 0750    GFR Estimated Creatinine Clearance: 36.9 mL/min (A) (by C-G formula based on SCr of 1.75 mg/dL (H)). No results for input(s): LIPASE, AMYLASE in the last 72 hours. No results for input(s): CKTOTAL, CKMB, CKMBINDEX, TROPONINI in the last 72 hours. Invalid input(s): POCBNP No results for input(s): DDIMER in the last 72 hours. Recent Labs    08/08/18 0341  HGBA1C 6.1*   Recent Labs    08/08/18 0341  CHOL 143  HDL 38*  LDLCALC 60  TRIG 098*  CHOLHDL 3.8   No results for input(s): TSH, T4TOTAL, T3FREE, THYROIDAB  in the last 72 hours.  Invalid input(s): FREET3 No results for input(s): VITAMINB12, FOLATE, FERRITIN, TIBC, IRON, RETICCTPCT in the last 72 hours. Coags: No results for input(s): INR in the last 72 hours.  Invalid input(s): PT Microbiology: Recent Results (from the past 240 hour(s))  MRSA PCR Screening     Status: None   Collection Time: 08/05/18 11:33 AM  Result Value Ref Range Status   MRSA by PCR NEGATIVE NEGATIVE Final    Comment:        The GeneXpert MRSA Assay (FDA approved for NASAL specimens only), is one component of a comprehensive MRSA colonization surveillance program. It is not intended to diagnose MRSA infection nor to guide or monitor treatment for MRSA infections. Performed at [facility], [address], [place], [place] [address]     FURTHER DISCHARGE INSTRUCTIONS:  Get Medicines reviewed and adjusted: Please take all your medications with you for your next visit with your Primary MD  Laboratory/radiological data: Please request your Primary MD to go over all hospital tests and procedure/radiological results at the follow up, please ask your Primary MD to get all Hospital records sent to  his/her office.  In some cases, they will be blood work, cultures and biopsy results pending at the time of your discharge. Please request that your primary care M.D. goes through all the records of your hospital data and follows up on these results.  Also Note the following: If you experience worsening of your admission symptoms, develop shortness of breath, life threatening emergency, suicidal or homicidal thoughts you must seek medical attention immediately by calling 911 or calling your MD immediately  if symptoms less severe.  You must read complete instructions/literature along with all the possible adverse reactions/side effects for all the Medicines you take and that have been prescribed to you. Take any new Medicines after you have completely understood and  accpet all the possible adverse reactions/side effects.   Do not drive when taking Pain medications or sleeping medications (Benzodaizepines)  Do not take more than prescribed Pain, Sleep and Anxiety Medications. It is not advisable to combine anxiety,sleep and pain medications without talking with your primary care practitioner  Special Instructions: If you have smoked or chewed Tobacco  in the last 2 yrs please stop smoking, stop any regular Alcohol  and or any Recreational drug use.  Wear Seat belts while driving.  Please note: You were cared for by a hospitalist during your hospital stay. Once you are discharged, your primary care physician will handle any further medical issues. Please note that NO REFILLS for any discharge medications will be authorized once you are discharged, as it is imperative that you return to your primary care physician (or establish a relationship with a primary care physician if you do not have one) for your post hospital discharge needs so that they can reassess your need for medications and monitor your lab values.  Total Time spent coordinating discharge including counseling, education and face to face time equals 35 minutes.  Signed: Shanker Ghimire 08/10/2018 8:11 AM

## 2018-08-10 NOTE — Progress Notes (Addendum)
Physical Therapy Treatment & Discharge Patient Details Name: Isaiah Brady MRN: [id number] DOB: Dec 14, 1932 Today's Date: 08/10/2018    History of Present Illness Pt is an 84 y.o. male admitted 08/05/18 with increasing falls; found to have AKI on CKD III. Also with acute respiratory, treated for CPAP; encephalopathy likely metabolic from uremia; new onset a-fib. CT showed now acute intracranial abnormality; chronic ischemic changes with R basal ganglia lacunar stroke. PMH includes COPD, HTN, peripheral neuropathy.   PT Comments    Pt has progressed well with mobility. Mod indep for transfers and amb using RW; stair training with rail support at [place]. SpO2 >94% on RA throughout. Educ pt and son regarding use of pulse oximeter to monitor pt's O2 saturations. Pt with no further questions or concerns; has met short-term PT goals. Will d/c acute PT.    Follow Up Recommendations  Home health PT;Supervision/Assistance - 24 hour     Equipment Recommendations  None recommended by PT    Recommendations for Other Services       Precautions / Restrictions Precautions Precautions: Fall Restrictions Weight Bearing Restrictions: No    Mobility  Bed Mobility Overal bed mobility: Modified Independent                Transfers Overall transfer level: Modified independent Equipment used: Rolling walker (2 wheeled) Transfers: Sit to/from Stand              Ambulation/Gait Ambulation/Gait assistance: Modified independent (Device/Increase time) Gait Distance (Feet): 300 Feet Assistive device: Rolling walker (2 wheeled) Gait Pattern/deviations: Step-through pattern;Decreased stride length;Trunk flexed Gait velocity: Decreased Gait velocity interpretation: 1.31 - 2.62 ft/sec, indicative of limited community ambulator     Stairs Stairs: Yes Stairs assistance: Supervision Stair Management: One rail Right;Step to pattern;Forwards Number of Stairs: 2 General stair  comments: Ascend/descend 2 steps with single UE support; supervision for safety   Wheelchair Mobility    Modified Rankin (Stroke Patients Only)       Balance Overall balance assessment: Needs assistance   Sitting balance-[name] Scale: Good       Standing balance-[name] Scale: Good                              Cognition Arousal/Alertness: Awake/alert Behavior During Therapy: Flat affect Overall Cognitive Status: Within Functional Limits for tasks assessed                                        Exercises      General Comments General comments (skin integrity, edema, etc.): Son Merry Proud) present      Pertinent Vitals/Pain Pain Assessment: No/denies pain    Home Living                      Prior Function            PT Goals (current goals can now be found in the care plan section) Acute Rehab PT Goals Patient Stated Goal: Return home PT Goal Formulation: With patient Time For Goal Achievement: 08/22/18 Potential to Achieve Goals: Good Progress towards PT goals: Goals met/education completed, patient discharged from PT    Frequency    Min 3X/week      PT Plan Current plan remains appropriate    Co-evaluation              AM-PAC PT "  6 Clicks" Daily Activity  Outcome Measure  Difficulty turning over in bed (including adjusting bedclothes, sheets and blankets)?: None Difficulty moving from lying on back to sitting on the side of the bed? : None Difficulty sitting down on and standing up from a chair with arms (e.g., wheelchair, bedside commode, etc,.)?: None Help needed moving to and from a bed to chair (including a wheelchair)?: A Little Help needed walking in hospital room?: A Little Help needed climbing 3-5 steps with a railing? : A Little 6 Click Score: 21    End of Session Equipment Utilized During Treatment: Gait belt Activity Tolerance: Patient tolerated treatment well Patient left: (with transport for  d/c) Nurse Communication: Mobility status PT Visit Diagnosis: Other abnormalities of gait and mobility (R26.89)     Time: 3343-5686 PT Time Calculation (min) (ACUTE ONLY): 15 min  Charges:  $Gait Training: 8-22 mins                    Mabeline Caras, PT, DPT Acute Rehabilitation Services  Pager [phone] Office [phone]  Derry Lory 08/10/2018, 9:45 AM

## 2018-08-10 NOTE — Plan of Care (Signed)

## 2018-08-11 ENCOUNTER — Inpatient Hospital Stay (HOSPITAL_COMMUNITY): Payer: Medicare Other

## 2018-08-11 ENCOUNTER — Inpatient Hospital Stay (HOSPITAL_COMMUNITY)
Admission: EM | Admit: 2018-08-11 | Discharge: 2018-08-17 | DRG: 683 | Disposition: A | Discharge status: Skilled Nursing Facility | Payer: Medicare Other | Attending: Internal Medicine | Admitting: Internal Medicine

## 2018-08-11 ENCOUNTER — Encounter (HOSPITAL_COMMUNITY): Payer: Self-pay | Admitting: Emergency Medicine

## 2018-08-11 DIAGNOSIS — I480 Paroxysmal atrial fibrillation: Secondary | ICD-10-CM | POA: Diagnosis present

## 2018-08-11 DIAGNOSIS — L409 Psoriasis, unspecified: Secondary | ICD-10-CM | POA: Diagnosis present

## 2018-08-11 DIAGNOSIS — Z806 Family history of leukemia: Secondary | ICD-10-CM | POA: Diagnosis not present

## 2018-08-11 DIAGNOSIS — R296 Repeated falls: Secondary | ICD-10-CM | POA: Diagnosis present

## 2018-08-11 DIAGNOSIS — K219 Gastro-esophageal reflux disease without esophagitis: Secondary | ICD-10-CM | POA: Diagnosis not present

## 2018-08-11 DIAGNOSIS — Y92009 Unspecified place in unspecified non-institutional (private) residence as the place of occurrence of the external cause: Secondary | ICD-10-CM | POA: Diagnosis not present

## 2018-08-11 DIAGNOSIS — D72829 Elevated white blood cell count, unspecified: Secondary | ICD-10-CM | POA: Diagnosis not present

## 2018-08-11 DIAGNOSIS — I272 Pulmonary hypertension, unspecified: Secondary | ICD-10-CM | POA: Diagnosis not present

## 2018-08-11 DIAGNOSIS — Z66 Do not resuscitate: Secondary | ICD-10-CM | POA: Diagnosis not present

## 2018-08-11 DIAGNOSIS — Z8673 Personal history of transient ischemic attack (TIA), and cerebral infarction without residual deficits: Secondary | ICD-10-CM | POA: Diagnosis not present

## 2018-08-11 DIAGNOSIS — J449 Chronic obstructive pulmonary disease, unspecified: Secondary | ICD-10-CM | POA: Diagnosis not present

## 2018-08-11 DIAGNOSIS — K5900 Constipation, unspecified: Secondary | ICD-10-CM | POA: Diagnosis not present

## 2018-08-11 DIAGNOSIS — I4510 Unspecified right bundle-branch block: Secondary | ICD-10-CM | POA: Diagnosis not present

## 2018-08-11 DIAGNOSIS — N179 Acute kidney failure, unspecified: Principal | ICD-10-CM

## 2018-08-11 DIAGNOSIS — F329 Major depressive disorder, single episode, unspecified: Secondary | ICD-10-CM | POA: Diagnosis not present

## 2018-08-11 DIAGNOSIS — T50995A Adverse effect of other drugs, medicaments and biological substances, initial encounter: Secondary | ICD-10-CM | POA: Diagnosis present

## 2018-08-11 DIAGNOSIS — Z7901 Long term (current) use of anticoagulants: Secondary | ICD-10-CM

## 2018-08-11 DIAGNOSIS — F419 Anxiety disorder, unspecified: Secondary | ICD-10-CM | POA: Diagnosis not present

## 2018-08-11 DIAGNOSIS — N183 Chronic kidney disease, stage 3 (moderate): Secondary | ICD-10-CM | POA: Diagnosis present

## 2018-08-11 DIAGNOSIS — I361 Nonrheumatic tricuspid (valve) insufficiency: Secondary | ICD-10-CM | POA: Diagnosis not present

## 2018-08-11 DIAGNOSIS — G629 Polyneuropathy, unspecified: Secondary | ICD-10-CM | POA: Diagnosis present

## 2018-08-11 DIAGNOSIS — Z87891 Personal history of nicotine dependence: Secondary | ICD-10-CM

## 2018-08-11 DIAGNOSIS — Z9981 Dependence on supplemental oxygen: Secondary | ICD-10-CM

## 2018-08-11 DIAGNOSIS — J9611 Chronic respiratory failure with hypoxia: Secondary | ICD-10-CM | POA: Diagnosis not present

## 2018-08-11 DIAGNOSIS — Z79899 Other long term (current) drug therapy: Secondary | ICD-10-CM | POA: Diagnosis not present

## 2018-08-11 DIAGNOSIS — Z7952 Long term (current) use of systemic steroids: Secondary | ICD-10-CM

## 2018-08-11 DIAGNOSIS — I129 Hypertensive chronic kidney disease with stage 1 through stage 4 chronic kidney disease, or unspecified chronic kidney disease: Secondary | ICD-10-CM | POA: Diagnosis present

## 2018-08-11 DIAGNOSIS — I4891 Unspecified atrial fibrillation: Secondary | ICD-10-CM | POA: Diagnosis not present

## 2018-08-11 DIAGNOSIS — I10 Essential (primary) hypertension: Secondary | ICD-10-CM | POA: Diagnosis not present

## 2018-08-11 DIAGNOSIS — Z7189 Other specified counseling: Secondary | ICD-10-CM | POA: Diagnosis not present

## 2018-08-11 DIAGNOSIS — F418 Other specified anxiety disorders: Secondary | ICD-10-CM | POA: Diagnosis not present

## 2018-08-11 DIAGNOSIS — R2681 Unsteadiness on feet: Secondary | ICD-10-CM | POA: Diagnosis not present

## 2018-08-11 DIAGNOSIS — M6281 Muscle weakness (generalized): Secondary | ICD-10-CM | POA: Diagnosis not present

## 2018-08-11 DIAGNOSIS — E1142 Type 2 diabetes mellitus with diabetic polyneuropathy: Secondary | ICD-10-CM | POA: Diagnosis not present

## 2018-08-11 DIAGNOSIS — Z515 Encounter for palliative care: Secondary | ICD-10-CM

## 2018-08-11 DIAGNOSIS — R4182 Altered mental status, unspecified: Secondary | ICD-10-CM | POA: Diagnosis not present

## 2018-08-11 DIAGNOSIS — N19 Unspecified kidney failure: Secondary | ICD-10-CM | POA: Diagnosis not present

## 2018-08-11 DIAGNOSIS — F339 Major depressive disorder, recurrent, unspecified: Secondary | ICD-10-CM | POA: Diagnosis not present

## 2018-08-11 DIAGNOSIS — Z8249 Family history of ischemic heart disease and other diseases of the circulatory system: Secondary | ICD-10-CM

## 2018-08-11 LAB — COMPREHENSIVE METABOLIC PANEL
ALK PHOS: 62 U/L (ref 38–126)
ALT: 12 U/L (ref 0–44)
ALT: 24 U/L (ref 0–44)
AST: 86 U/L — AB (ref 15–41)
AST: 29 U/L (ref 15–41)
Albumin: 3.3 g/dL — ABNORMAL LOW (ref 3.5–5.0)
Albumin: 3.4 g/dL — ABNORMAL LOW (ref 3.5–5.0)
Alkaline Phosphatase: 62 U/L (ref 38–126)
Anion gap: 22 — ABNORMAL HIGH (ref 5–15)
Anion gap: 21 — ABNORMAL HIGH (ref 5–15)
BILIRUBIN TOTAL: 2.5 mg/dL — AB (ref 0.3–1.2)
BUN: 84 mg/dL — ABNORMAL HIGH (ref 8–23)
BUN: 86 mg/dL — ABNORMAL HIGH (ref 8–23)
CHLORIDE: 86 mmol/L — AB (ref 98–111)
CO2: 24 mmol/L (ref 22–32)
CO2: 28 mmol/L (ref 22–32)
CREATININE: 4.72 mg/dL — AB (ref 0.61–1.24)
Calcium: 7.7 mg/dL — ABNORMAL LOW (ref 8.9–10.3)
Calcium: 8.2 mg/dL — ABNORMAL LOW (ref 8.9–10.3)
Chloride: 86 mmol/L — ABNORMAL LOW (ref 98–111)
Creatinine, Ser: 4.85 mg/dL — ABNORMAL HIGH (ref 0.61–1.24)
GFR calc Af Amer: 12 mL/min — ABNORMAL LOW (ref >60)
GFR calc Af Amer: 12 mL/min — ABNORMAL LOW (ref >60)
GFR calc non Af Amer: 10 mL/min — ABNORMAL LOW (ref >60)
GFR calc non Af Amer: 10 mL/min — ABNORMAL LOW (ref >60)
Glucose, Bld: 84 mg/dL (ref 70–99)
Glucose, Bld: 101 mg/dL — ABNORMAL HIGH (ref 70–99)
Potassium: 6.3 mmol/L (ref 3.5–5.1)
Potassium: 4.6 mmol/L (ref 3.5–5.1)
Sodium: 132 mmol/L — ABNORMAL LOW (ref 135–145)
Sodium: 135 mmol/L (ref 135–145)
TOTAL PROTEIN: 6.2 g/dL — AB (ref 6.5–8.1)
Total Bilirubin: 0.8 mg/dL (ref 0.3–1.2)
Total Protein: 6.6 g/dL (ref 6.5–8.1)

## 2018-08-11 LAB — CBC
HEMATOCRIT: 50.2 % (ref 39.0–52.0)
Hemoglobin: 15.7 g/dL (ref 13.0–17.0)
MCH: 30 pg (ref 26.0–34.0)
MCHC: 31.3 g/dL (ref 30.0–36.0)
MCV: 96 fL (ref 78.0–100.0)
Platelets: 235 10*3/uL (ref 150–400)
RBC: 5.23 MIL/uL (ref 4.22–5.81)
RDW: 14.8 % (ref 11.5–15.5)
WBC: 23 10*3/uL — AB (ref 4.0–10.5)

## 2018-08-11 LAB — SODIUM, URINE, RANDOM: SODIUM UR: 22 mmol/L

## 2018-08-11 LAB — CREATININE, URINE, RANDOM: Creatinine, Urine: 251.37 mg/dL

## 2018-08-11 LAB — I-STAT CG4 LACTIC ACID, ED
LACTIC ACID, VENOUS: 1.66 mmol/L (ref 0.5–1.9)
Lactic Acid, Venous: 1.55 mmol/L (ref 0.5–1.9)

## 2018-08-11 MED ORDER — GABAPENTIN 300 MG PO CAPS
300.0000 mg | ORAL_CAPSULE | Freq: Three times a day (TID) | ORAL | Status: DC
  Administered 2018-08-11 – 2018-08-12 (×2): 300 mg via ORAL
  Filled 2018-08-11 (×2): qty 1

## 2018-08-11 MED ORDER — SENNOSIDES-DOCUSATE SODIUM 8.6-50 MG PO TABS: 1.0000 | ORAL_TABLET | Freq: Every evening | ORAL | Status: DC | PRN

## 2018-08-11 MED ORDER — HYDRALAZINE HCL 20 MG/ML IJ SOLN: 5.0000 mg | INTRAMUSCULAR | Status: DC | PRN

## 2018-08-11 MED ORDER — SODIUM CHLORIDE 0.9 % IV SOLN
INTRAVENOUS | Status: DC
  Administered 2018-08-11 – 2018-08-15 (×6): via INTRAVENOUS

## 2018-08-11 MED ORDER — ALPRAZOLAM 0.5 MG PO TABS
1.0000 mg | ORAL_TABLET | Freq: Two times a day (BID) | ORAL | Status: DC
  Administered 2018-08-11 – 2018-08-12 (×2): 1 mg via ORAL
  Filled 2018-08-11 (×2): qty 2

## 2018-08-11 MED ORDER — ALBUTEROL SULFATE (2.5 MG/3ML) 0.083% IN NEBU: 2.5000 mg | INHALATION_SOLUTION | RESPIRATORY_TRACT | Status: DC | PRN

## 2018-08-11 MED ORDER — ACETAMINOPHEN 325 MG PO TABS
650.0000 mg | ORAL_TABLET | Freq: Four times a day (QID) | ORAL | Status: DC | PRN
  Administered 2018-08-13 – 2018-08-17 (×2): 650 mg via ORAL
  Filled 2018-08-11 (×2): qty 2

## 2018-08-11 MED ORDER — PANTOPRAZOLE SODIUM 40 MG PO TBEC
40.0000 mg | DELAYED_RELEASE_TABLET | Freq: Every day | ORAL | Status: DC
  Administered 2018-08-12 – 2018-08-17 (×6): 40 mg via ORAL
  Filled 2018-08-11 (×6): qty 1

## 2018-08-11 MED ORDER — FLUOXETINE HCL 20 MG PO CAPS
20.0000 mg | ORAL_CAPSULE | Freq: Every day | ORAL | Status: DC
  Administered 2018-08-12 – 2018-08-17 (×6): 20 mg via ORAL
  Filled 2018-08-11 (×6): qty 1

## 2018-08-11 MED ORDER — AMITRIPTYLINE HCL 50 MG PO TABS
50.0000 mg | ORAL_TABLET | Freq: Every day | ORAL | Status: DC
  Administered 2018-08-11 – 2018-08-16 (×6): 50 mg via ORAL
  Filled 2018-08-11 (×6): qty 1

## 2018-08-11 MED ORDER — ONDANSETRON HCL 4 MG/2ML IJ SOLN: 4.0000 mg | Freq: Four times a day (QID) | INTRAMUSCULAR | Status: DC | PRN

## 2018-08-11 MED ORDER — METOPROLOL TARTRATE 50 MG PO TABS
50.0000 mg | ORAL_TABLET | Freq: Two times a day (BID) | ORAL | Status: DC
  Administered 2018-08-11 – 2018-08-12 (×3): 50 mg via ORAL
  Filled 2018-08-11 (×3): qty 1

## 2018-08-11 MED ORDER — ONDANSETRON HCL 4 MG PO TABS: 4.0000 mg | ORAL_TABLET | Freq: Four times a day (QID) | ORAL | Status: DC | PRN

## 2018-08-11 MED ORDER — DM-GUAIFENESIN ER 30-600 MG PO TB12: 1.0000 | ORAL_TABLET | Freq: Two times a day (BID) | ORAL | Status: DC | PRN

## 2018-08-11 MED ORDER — ACETAMINOPHEN 650 MG RE SUPP: 650.0000 mg | Freq: Four times a day (QID) | RECTAL | Status: DC | PRN

## 2018-08-11 MED ORDER — DILTIAZEM HCL ER COATED BEADS 120 MG PO CP24
240.0000 mg | ORAL_CAPSULE | Freq: Every day | ORAL | Status: DC
  Administered 2018-08-12 – 2018-08-17 (×6): 240 mg via ORAL
  Filled 2018-08-11 (×6): qty 2

## 2018-08-11 MED ORDER — OMEGA-3-ACID ETHYL ESTERS 1 G PO CAPS
1.0000 g | ORAL_CAPSULE | Freq: Two times a day (BID) | ORAL | Status: DC
  Administered 2018-08-12 – 2018-08-17 (×11): 1 g via ORAL
  Filled 2018-08-11 (×11): qty 1

## 2018-08-11 MED ORDER — ZOLPIDEM TARTRATE 5 MG PO TABS: 5.0000 mg | ORAL_TABLET | Freq: Every evening | ORAL | Status: DC | PRN

## 2018-08-11 MED ORDER — TRAMADOL HCL 50 MG PO TABS: 100.0000 mg | ORAL_TABLET | Freq: Two times a day (BID) | ORAL | Status: DC | PRN

## 2018-08-11 MED ORDER — APIXABAN 2.5 MG PO TABS
2.5000 mg | ORAL_TABLET | Freq: Two times a day (BID) | ORAL | Status: DC
  Administered 2018-08-11 – 2018-08-12 (×2): 2.5 mg via ORAL
  Filled 2018-08-11 (×2): qty 1

## 2018-08-11 MED ORDER — UMECLIDINIUM-VILANTEROL 62.5-25 MCG/INH IN AEPB
1.0000 | INHALATION_SPRAY | Freq: Every day | RESPIRATORY_TRACT | Status: DC
  Administered 2018-08-12 – 2018-08-17 (×6): 1 via RESPIRATORY_TRACT
  Filled 2018-08-11: qty 14

## 2018-08-11 MED ORDER — PREDNISONE 5 MG PO TABS
5.0000 mg | ORAL_TABLET | Freq: Two times a day (BID) | ORAL | Status: DC
  Administered 2018-08-12: 5 mg via ORAL
  Filled 2018-08-11: qty 1

## 2018-08-11 NOTE — Progress Notes (Signed)
Called ER RN for report. Room ready.  

## 2018-08-11 NOTE — H&P (Signed)
History and Physical    [name] [name]:[id number] DOB: June 12, 1933 DOA: 08/11/2018  Referring MD/NP/PA:   PCP: Richardean Chimera, MD   Patient coming from:  The patient is coming from home.  At baseline, pt is partially dependent for most of ADL.   Chief Complaint: decreased urine output  HPI: Isaiah Brady is a 84 y.o. male with medical history significant of hypertension, chronic respiratory failure due to COPD on 2 L nasal cannula oxygen, GERD, depression with anxiety, peripheral neuropathy, psoriasis, atrial fibrillation on Eliquis, pulmonary hypertension, who presents with decreased urine output.  Patient was recently hospitalized from 9/28-10/3 due to altered mental status, possible pneumonia and AKI.  Patient had creatinine 5.2, treated with CRRT, creatinine improved to 1.75 at discharge. He completed 5-day antibiotic treatment for PNA. Mental status came back to baseline. Pt was just discharged yesterday.  Patient states that since he went home, he noted decreased urine output since yesterday when his catheter was removed.  States he urinated once yesterday and none today. Pt denies symptoms of UTI.  No nausea, vomiting, diarrhea or abdominal pain.  He has mild cough and mild shortness of breath due to COPD, which has not changed from baseline.  No unilateral weakness.  He has generalized weakness.  No fever or chills.  He states that he drinks fluids normally, including cups of coffee today. Bladder scan in ED showed 78 ml urine in bladder.  ED Course: pt was found to have AKI with creatinine up from 1.75 on 08/09/2018 to 4.85, BUN 86, potassium 4.6, anion gap 21, lactic acid of 1.66, WBC 23 (11.9 on 08/09/2018), pending urinalysis, temperature normal, no tachycardia, slightly tachypnea, oxygen satting 93% on 2 L nasal cannula oxygen.  Patient is admitted to telemetry bed as inpatient.   Review of Systems:   General: no fevers, chills, no body weight gain, has fatigue HEENT: no blurry  vision, hearing changes or sore throat Respiratory: has mild dyspnea, coughing, no wheezing CV: no chest pain, no palpitations GI: no nausea, vomiting, abdominal pain, diarrhea, constipation GU: no dysuria, burning on urination, increased urinary frequency, hematuria. Has decreased urine output. Ext: trace leg edema Neuro: no unilateral weakness, numbness, or tingling, no vision change or hearing loss Skin: has psoriatic rash in both ankles. no skin tear. MSK: No muscle spasm, no deformity, no limitation of range of movement in spin Heme: No easy bruising.  Travel history: No recent long distant travel.  Allergy: No Known Allergies  Past Medical History:  Diagnosis Date  . COPD (chronic obstructive pulmonary disease) (HCC)   . Hypertension   . Peripheral neuropathy     Past Surgical History:  Procedure Laterality Date  . ESOPHAGOGASTRODUODENOSCOPY N/A 03/23/2013   Procedure: ESOPHAGOGASTRODUODENOSCOPY (EGD);  Surgeon: Malissa Hippo, MD;  Location: [facility];  Service: Endoscopy;  Laterality: N/A;  1120  . TONSILLECTOMY      Social History:  reports that he has quit smoking. He has never used smokeless tobacco. He reports that he does not drink alcohol or use drugs.  Family History:  Family History  Problem Relation Age of Onset  . Leukemia Mother   . Suicidality Father   . CAD Brother        s/p CABG  . Stroke Brother        "small stroke"  . Suicidality Brother   . Suicidality Brother   . Other Brother        car accident  Prior to Admission medications   Medication Sig Start Date End Date Taking? Authorizing Provider  alendronate (FOSAMAX) 70 MG tablet Take 70 mg by mouth every Sunday. Take with a full glass of water on an empty stomach.   Yes [provider]  ALPRAZolam (XANAX) 1 MG tablet Take 1 mg by mouth 2 (two) times daily before a meal.   Yes [provider]  apixaban (ELIQUIS) 2.5 MG TABS tablet Take 1 tablet (2.5 mg total) by mouth  2 (two) times daily. 08/10/18  Yes Ghimire, Werner Lean, MD  fish oil-omega-3 fatty acids 1000 MG capsule Take 1 g by mouth 2 (two) times daily before a meal.    Yes [provider]  FLUoxetine (PROZAC) 20 MG capsule Take 20 mg by mouth daily.   Yes [provider]  gabapentin (NEURONTIN) 300 MG capsule Take 300 mg by mouth 3 (three) times daily.    Yes [provider]  metoprolol tartrate (LOPRESSOR) 50 MG tablet Take 1 tablet (50 mg total) by mouth 2 (two) times daily. 08/10/18  Yes Ghimire, Werner Lean, MD  omeprazole (PRILOSEC) 20 MG capsule Take 20 mg by mouth 2 (two) times daily before a meal.   Yes [provider]  traMADol (ULTRAM) 50 MG tablet Take 100 mg by mouth 4 (four) times daily as needed (for pain).  05/10/18  Yes [provider]  umeclidinium-vilanterol (ANORO ELLIPTA) 62.5-25 MCG/INH AEPB Inhale 1 puff into the lungs daily.   Yes [provider]  amitriptyline (ELAVIL) 50 MG tablet Take 50 mg by mouth at bedtime.    [provider]    Physical Exam: Vitals:   08/11/18 1853 08/11/18 1900 08/11/18 1945 08/11/18 2000  BP: 135/80 134/67 138/69 134/66  Pulse: 76 75 76 73  Resp: (!) 22 (!) 22 (!) 21 20  Temp:      TempSrc:      SpO2: 93% 100% 92% 96%   General: Not in acute distress HEENT:       Eyes: PERRL, EOMI, no scleral icterus.       ENT: No discharge from the ears and nose, no pharynx injection, no tonsillar enlargement.        Neck: No JVD, no bruit, no mass felt. Heme: No neck lymph node enlargement. Cardiac: S1/S2, RRR, No murmurs, No gallops or rubs. Respiratory: No rales, wheezing, rhonchi or rubs. GI: Soft, nondistended, nontender, no rebound pain, no organomegaly, BS present. GU: No hematuria Ext: has trace leg edema bilaterally. 2+DP/PT pulse bilaterally. Musculoskeletal: No joint deformities, No joint redness or warmth, no limitation of ROM in spin. Skin: has psoriatic rash in both ankles. Neuro:  Alert, oriented X3, cranial nerves II-XII grossly intact, moves all extremities normally. Psych: Patient is not psychotic, no suicidal or hemocidal ideation.   Labs on Admission: I have personally reviewed following labs and imaging studies  CBC: Recent Labs  Lab 08/05/18 1257 08/07/18 0442 08/08/18 0341 08/11/18 1619  WBC 16.3* 14.1* 11.9* 23.0*  NEUTROABS 14.0*  --   --   --   HGB 11.6* 12.1* 13.1 15.7  HCT 39.1 39.8 42.0 50.2  MCV 100.8* 96.8 95.5 96.0  PLT 189 164 172 235   Basic Metabolic Panel: Recent Labs  Lab 08/05/18 2000 08/06/18 0430 08/06/18 0858 08/06/18 1546 08/07/18 0442 08/08/18 0341 08/09/18 0750 08/11/18 1619 08/11/18 1808  NA 136 141  --  138 141 140 140 132* 135  K 3.8 4.4  --  4.5 3.7 3.6 3.4*  6.3* 4.6  CL 102 103  --  99 101 98 88* 86* 86*  CO2 22 27  --  29 31 34* 34* 24 28  GLUCOSE 156* 129*  --  131* 101* 101* 125* 84 101*  BUN 55* 43*  --  34* 33* 33* 39* 84* 86*  CREATININE 3.19* 2.34*  --  1.99* 1.79* 1.68* 1.75* 4.72* 4.85*  CALCIUM 8.4* 8.5*  --  8.0* 8.2* 8.0* 8.8* 7.7* 8.2*  MG  --   --  2.3  --  1.9 1.8  --   --   --   PHOS 3.7 3.4  --  3.2 3.0  3.0 2.5  --   --   --    GFR: Estimated Creatinine Clearance: 13.3 mL/min (A) (by C-G formula based on SCr of 4.85 mg/dL (H)). Liver Function Tests: Recent Labs  Lab 08/05/18 1257  08/06/18 1546 08/07/18 0442 08/08/18 0341 08/11/18 1619 08/11/18 1808  AST 21  --   --   --   --  86* 29  ALT 22  --   --   --   --  12 24  ALKPHOS 60  --   --   --   --  62 62  BILITOT 0.6  --   --   --   --  2.5* 0.8  PROT 5.8*  --   --   --   --  6.2* 6.6  ALBUMIN 3.0*   < > 2.8* 2.9* 2.8* 3.3* 3.4*   < > = values in this interval not displayed.   No results for input(s): LIPASE, AMYLASE in the last 168 hours. No results for input(s): AMMONIA in the last 168 hours. Coagulation Profile: Recent Labs  Lab 08/05/18 1257  INR 1.20   Cardiac Enzymes: Recent Labs  Lab 08/05/18 1534  CKTOTAL 25*     BNP (last 3 results) No results for input(s): PROBNP in the last 8760 hours. HbA1C: No results for input(s): HGBA1C in the last 72 hours. CBG: Recent Labs  Lab 08/06/18 1520 08/06/18 2313 08/07/18 0320 08/07/18 0749 08/07/18 1125  GLUCAP 144* 95 96 130* 125*   Lipid Profile: No results for input(s): CHOL, HDL, LDLCALC, TRIG, CHOLHDL, LDLDIRECT in the last 72 hours. Thyroid Function Tests: No results for input(s): TSH, T4TOTAL, FREET4, T3FREE, THYROIDAB in the last 72 hours. Anemia Panel: No results for input(s): VITAMINB12, FOLATE, FERRITIN, TIBC, IRON, RETICCTPCT in the last 72 hours. Urine analysis:    Component Value Date/Time   COLORURINE YELLOW 08/09/2018 1315   APPEARANCEUR CLEAR 08/09/2018 1315   LABSPEC 1.011 08/09/2018 1315   PHURINE 5.0 08/09/2018 1315   GLUCOSEU NEGATIVE 08/09/2018 1315   HGBUR SMALL (A) 08/09/2018 1315   BILIRUBINUR NEGATIVE 08/09/2018 1315   KETONESUR NEGATIVE 08/09/2018 1315   PROTEINUR 100 (A) 08/09/2018 1315   NITRITE NEGATIVE 08/09/2018 1315   LEUKOCYTESUR NEGATIVE 08/09/2018 1315   Sepsis Labs: @LABRCNTIP (procalcitonin:4,lacticidven:4) ) Recent Results (from the past 240 hour(s))  MRSA PCR Screening     Status: None   Collection Time: 08/05/18 11:33 AM  Result Value Ref Range Status   MRSA by PCR NEGATIVE NEGATIVE Final    Comment:        The GeneXpert MRSA Assay (FDA approved for NASAL specimens only), is one component of a comprehensive MRSA colonization surveillance program. It is not intended to diagnose MRSA infection nor to guide or monitor treatment for MRSA infections. Performed at [facility], [address]  [place], [place] [address]      Radiological Exams on Admission: No results found.   EKG: Independently reviewed.  Sinus rhythm, QTC 463, right bundle blockade which is not new, Q waves in lead III   Assessment/Plan Principal Problem:   AKI (acute kidney injury) (HCC) Active Problems:    Essential hypertension, benign   GERD (gastroesophageal reflux disease)   Paroxysmal atrial fibrillation (HCC)   COPD (chronic obstructive pulmonary disease) (HCC)   Depression with anxiety   Leukocytosis   Chronic respiratory failure with hypoxia (HCC)   Psoriasis   AKI (acute kidney injury) (HCC): Etiology is not clear.  No urinary retention by bladder scan.  No symptoms of UTI.  The ratio of BUN/creatinine is 86/4.85, indicating possible prerenal failure. Pt needed CRRT in previous admission. He also had fluid overload in previous admission, will need to hydrate patient gently to avoid fluid overload.  If renal function does not improve, patient may need CRRT again, therefore I will admit the patient has inpatient status.  Currently potassium is normal 4.6, no worsening shortness of breath, does not need dialysis at this moment.  - will admit to tele bed as inpt - IVF: NS at 100 cc/h - Follow up renal function by BMP - Check FeNa  - avoid any renal toxic medications, such as NSAIDs - US-renal - put foley cather for monitoring intake and output strictly - will check CBC with differential to see if pt has eosinophilia since patient is on PPI  HTN:  -Continue home medications: Metoprolol, Cardizem -IV hydralazine prn  GERD: -Protonix  Atrial Fibrillation: CHA2DS2-VASc Score is 3, needs oral anticoagulation. Patient is on Eliquis at home. Heart rate is well controlled. -Continue metoprolol and cardizem, and Eliquis  COPD (chronic obstructive pulmonary disease) and chronic respiratory failure with hypoxia: Patient is on 2 L nasal cannula oxygen at home.  No worsening shortness of breath.  No wheezing or rhonchi on auscultation.  Stable. - Bronchodilators  Depression and anxiety: Stable, no suicidal or homicidal ideations. -Continue home medications:   Leukocytosis: WBC increased from 11.9 at discharge to 23.0. No fever. He was treated for PNA in previous admission, but no fever or  worsening shortness of breath.  No chest pain.  Does not seem to have pneumonia today. -Follow-up urinalysis and blood culture  Psoriasis: -Continue home prednisone - Check cortisol level in the morning   Inpatient status:  # Patient requires inpatient status due to high intensity of service, high risk for further deterioration and high frequency of surveillance required.  I certify that at the point of admission it is my clinical judgment that the patient will require inpatient hospital care spanning beyond 2 midnights from the point of admission.  . This patient has multiple chronic comorbidities including hypertension, chronic respiratory failure due to COPD on 2 L nasal cannula oxygen, GERD, depression with anxiety, peripheral neuropathy, psoriasis, atrial fibrillation on Eliquis, pulmonary hypertension. . Now patient has presenting symptoms include decreased urine output. . The worrisome physical exam findings include leg edema since pt is at risk of fluid overload . The initial radiographic and laboratory data are worrisome because of significantly increased creatinine from 1.75-4.85, leukocytosis with WBC 23 . Current medical needs: please see my assessment and plan. Pt needed CRRT in previous admission. He also had fluid overload in previous admission,will hydrate patient gently to avoid fluid overload.  If renal function does not improve, patient may need CRRT again, therefore I will admit the patient has inpatient  status.  DVT ppx: SQ Heparin      Code Status: Full code Family Communication:  Yes, patient's son  at bed side Disposition Plan:  Anticipate discharge back to previous home environment Consults called:  none Admission status:  Inpatient/tele        Date of Service 08/11/2018    Lorretta Harp [facility] Pager [phone]  If 7PM-7AM, please contact night-coverage [email or web address] Password [email or web address] 08/11/2018, 8:24 PM

## 2018-08-11 NOTE — ED Notes (Signed)
Acuity increased post results. Per [place], [place] lactic acid, repeat CMP, and EKG ordered.

## 2018-08-11 NOTE — ED Notes (Signed)
Patient transported to Ultrasound 

## 2018-08-11 NOTE — ED Notes (Signed)
Bladder scan shows 78mL highest reading

## 2018-08-11 NOTE — ED Provider Notes (Signed)
[facility] EMERGENCY DEPARTMENT Provider Note   CSN: [id number] Arrival date & time: 08/11/18  1554     History   Chief Complaint Chief Complaint  Patient presents with  . Urinary Retention    HPI Isaiah Brady is a 84 y.o. male.  HPI  84 year old male presents with decreased urine output.  He was discharged from the hospital yesterday.  He was recently admitted for acute renal failure and was on CRRT due to hyperkalemia and acidosis.  Patient has only urinated once since yesterday afternoon.  He has no urge to urinate.  There is no abdominal pain, vomiting, weakness, leg swelling or shortness of breath.  Past Medical History:  Diagnosis Date  . COPD (chronic obstructive pulmonary disease) (HCC)   . Hypertension   . Peripheral neuropathy     Patient Active Problem List   Diagnosis Date Noted  . COPD (chronic obstructive pulmonary disease) (HCC) 08/11/2018  . Depression with anxiety 08/11/2018  . Metabolic acidosis 08/08/2018  . Acute metabolic encephalopathy 08/08/2018  . Paroxysmal atrial fibrillation (HCC) 08/07/2018  . AKI (acute kidney injury) (HCC) 08/05/2018  . Hyperkalemia 08/05/2018  . Encephalopathy 08/05/2018  . Pulmonary edema   . Essential hypertension, benign 03/12/2013  . GERD (gastroesophageal reflux disease) 03/12/2013  . Cough 03/12/2013    Past Surgical History:  Procedure Laterality Date  . ESOPHAGOGASTRODUODENOSCOPY N/A 03/23/2013   Procedure: ESOPHAGOGASTRODUODENOSCOPY (EGD);  Surgeon: Malissa Hippo, MD;  Location: [facility];  Service: Endoscopy;  Laterality: N/A;  1120  . TONSILLECTOMY          Home Medications    Prior to Admission medications   Medication Sig Start Date End Date Taking? Authorizing Provider  acetaminophen (TYLENOL) 325 MG tablet Take 325-650 mg by mouth every 6 (six) hours as needed (for pain or headaches).   Yes [provider]  alendronate (FOSAMAX) 70 MG tablet Take 70 mg by mouth  every Sunday. Take with a full glass of water on an empty stomach.   Yes [provider]  ALPRAZolam (XANAX) 1 MG tablet Take 1 mg by mouth 2 (two) times daily before a meal.   Yes [provider]  apixaban (ELIQUIS) 2.5 MG TABS tablet Take 1 tablet (2.5 mg total) by mouth 2 (two) times daily. 08/10/18  Yes Ghimire, Werner Lean, MD  fish oil-omega-3 fatty acids 1000 MG capsule Take 1 g by mouth 2 (two) times daily before a meal.    Yes [provider]  FLUoxetine (PROZAC) 20 MG capsule Take 20 mg by mouth daily.   Yes [provider]  gabapentin (NEURONTIN) 300 MG capsule Take 300 mg by mouth 3 (three) times daily.    Yes [provider]  metoprolol tartrate (LOPRESSOR) 50 MG tablet Take 1 tablet (50 mg total) by mouth 2 (two) times daily. 08/10/18  Yes Ghimire, Werner Lean, MD  omeprazole (PRILOSEC) 20 MG capsule Take 20 mg by mouth 2 (two) times daily before a meal.   Yes [provider]  predniSONE (DELTASONE) 5 MG tablet Take 5 mg by mouth 2 (two) times daily with a meal.   Yes [provider]  traMADol (ULTRAM) 50 MG tablet Take 100 mg by mouth 4 (four) times daily as needed (for pain).  05/10/18  Yes [provider]  umeclidinium-vilanterol (ANORO ELLIPTA) 62.5-25 MCG/INH AEPB Inhale 1 puff into the lungs daily.   Yes [provider]  amitriptyline (ELAVIL) 50 MG tablet Take 50 mg by  mouth at bedtime.    [provider]    Family History Family History  Problem Relation Age of Onset  . Leukemia Mother   . Suicidality Father   . CAD Brother        s/p CABG  . Stroke Brother        "small stroke"  . Suicidality Brother   . Suicidality Brother   . Other Brother        car accident    Social History Social History   Tobacco Use  . Smoking status: Former Games developer  . Smokeless tobacco: Never Used  . Tobacco comment: quit smoking 10 yrs ago x 50 85yrs  Substance Use Topics  . Alcohol use: No  . Drug  use: No     Allergies   Patient has no known allergies.   Review of Systems Review of Systems  Constitutional: Negative for fever.  Respiratory: Negative for shortness of breath.   Cardiovascular: Negative for chest pain.  Gastrointestinal: Negative for abdominal distention, abdominal pain and vomiting.  Genitourinary: Positive for decreased urine volume. Negative for dysuria.  Neurological: Negative for weakness.  All other systems reviewed and are negative.    Physical Exam Updated Vital Signs BP 134/67   Pulse 75   Temp 98.7 F (37.1 C) (Oral)   Resp (!) 22   SpO2 100%   Physical Exam  Constitutional: He appears well-developed and well-nourished. No distress.  HENT:  Head: Normocephalic and atraumatic.  Right Ear: External ear normal.  Left Ear: External ear normal.  Nose: Nose normal.  Eyes: Right eye exhibits no discharge. Left eye exhibits no discharge.  Neck: Neck supple.  Cardiovascular: Normal rate, regular Isaiah and normal heart sounds.  Pulmonary/Chest: Effort normal and breath sounds normal.  Abdominal: Soft. He exhibits no distension. There is no tenderness.  Musculoskeletal: He exhibits no edema.  Neurological: He is alert.  Skin: Skin is warm and dry. He is not diaphoretic.  Psychiatric: His mood appears not anxious.  Nursing note and vitals reviewed.    ED Treatments / Results  Labs (all labs ordered are listed, but only abnormal results are displayed) Labs Reviewed  CBC - Abnormal; Notable for the following components:      Result Value   WBC 23.0 (*)    All other components within normal limits  COMPREHENSIVE METABOLIC PANEL - Abnormal; Notable for the following components:   Sodium 132 (*)    Potassium 6.3 (*)    Chloride 86 (*)    BUN 84 (*)    Creatinine, Ser 4.72 (*)    Calcium 7.7 (*)    Total Protein 6.2 (*)    Albumin 3.3 (*)    AST 86 (*)    Total Bilirubin 2.5 (*)    GFR calc non Af Amer 10 (*)    GFR calc Af Amer 12 (*)     Anion gap 22 (*)    All other components within normal limits  COMPREHENSIVE METABOLIC PANEL - Abnormal; Notable for the following components:   Chloride 86 (*)    Glucose, Bld 101 (*)    BUN 86 (*)    Creatinine, Ser 4.85 (*)    Calcium 8.2 (*)    Albumin 3.4 (*)    GFR calc non Af Amer 10 (*)    GFR calc Af Amer 12 (*)    Anion gap 21 (*)    All other components within normal limits  URINALYSIS, ROUTINE W REFLEX MICROSCOPIC  I-STAT CG4 LACTIC ACID, ED  I-STAT CG4 LACTIC ACID, ED    EKG EKG Interpretation  Date/Time:  Friday August 11 2018 18:51:38 EDT Ventricular Rate:  80 PR Interval:    QRS Duration: 125 QT Interval:  401 QTC Calculation: 463 R Axis:   36 Text Interpretation:  Sinus Isaiah Right bundle branch block tachycardia resolved compared to Sept 30 2019 Confirmed by Pricilla Loveless [phone]) on 08/11/2018 6:55:18 PM   Radiology No results found.  Procedures Procedures (including critical care time)  Medications Ordered in ED Medications - No data to display   Initial Impression / Assessment and Plan / ED Course  I have reviewed the triage vital signs and the nursing notes.  Pertinent labs & imaging results that were available during my care of the patient were reviewed by me and considered in my medical decision making (see chart for details).     Unfortunately, the patient is back in acute kidney injury.  Creatinine up to 4.8.  Initial potassium 6.3 but this was hemolyzed and repeat is in the 4 range.  Unfortunately, he will need to come back into the hospital.  He does not appear to need emergent dialysis.  He does not appear to have urinary retention as a cause but does appear to have decreased urine output, likely from the renal failure.  Bladder scan shows less than 80 mL urine.  Dr. Clyde Lundborg to admit.  Final Clinical Impressions(s) / ED Diagnoses   Final diagnoses:  Acute kidney injury [facility])    ED Discharge Orders    None       Pricilla Loveless, MD 08/11/18 1950

## 2018-08-11 NOTE — ED Triage Notes (Signed)
Pt presents to ED for assessment of no urine output since yesterday when his catheter was removed prior to being dc'd from admission.  Patient denies lower abdominal pain, states 1 episode of urine since d/c.  Pt was admitted for kidney failure and son/pt/home health nurse were concerned.

## 2018-08-11 NOTE — ED Provider Notes (Signed)
Patient placed in Quick Look pathway, seen and evaluated   Chief Complaint: Difficulty urinating  HPI:   Patient discharged from hospital yesterday for AKI.  Notes decreased urine output since yesterday when his catheter was removed.  States he urinated once yesterday none today.  Denies abdominal pain, shortness of breath, chest pain.  Son notes that he is generally weak.  No fevers.  ROS: Decreased urine output, generalized weakness  Physical Exam:   Gen: No distress  Neuro: Awake and Alert  Skin: Warm    Focused Exam: Abdomen soft and nontender.  Heart regular rate and rhythm.  Bladder scan shows 78 mL   Initiation of care has begun. The patient has been counseled on the process, plan, and necessity for staying for the completion/evaluation, and the remainder of the medical screening examination    Jeanie Sewer, PA-C 08/11/18 1635    Doug Sou, MD 08/12/18 [phone]

## 2018-08-12 ENCOUNTER — Other Ambulatory Visit: Payer: Self-pay

## 2018-08-12 ENCOUNTER — Encounter (HOSPITAL_COMMUNITY): Payer: Self-pay | Admitting: Nephrology

## 2018-08-12 LAB — CBC WITH DIFFERENTIAL/PLATELET
BASOS ABS: 0.2 10*3/uL — AB (ref 0.0–0.1)
Basophils Relative: 1 %
EOS ABS: 0.4 10*3/uL (ref 0.0–0.7)
Eosinophils Relative: 2 %
HEMATOCRIT: 42.5 % (ref 39.0–52.0)
HEMOGLOBIN: 13.2 g/dL (ref 13.0–17.0)
LYMPHS PCT: 32 %
Lymphs Abs: 5.7 10*3/uL — ABNORMAL HIGH (ref 0.7–4.0)
MCH: 29.8 pg (ref 26.0–34.0)
MCHC: 31.1 g/dL (ref 30.0–36.0)
MCV: 95.9 fL (ref 78.0–100.0)
MONOS PCT: 10 %
Monocytes Absolute: 1.8 10*3/uL — ABNORMAL HIGH (ref 0.1–1.0)
NEUTROS ABS: 9.8 10*3/uL — AB (ref 1.7–7.7)
NEUTROS PCT: 55 %
Platelets: 202 10*3/uL (ref 150–400)
RBC: 4.43 MIL/uL (ref 4.22–5.81)
RDW: 14.7 % (ref 11.5–15.5)
WBC: 17.9 10*3/uL — ABNORMAL HIGH (ref 4.0–10.5)

## 2018-08-12 LAB — URINALYSIS, ROUTINE W REFLEX MICROSCOPIC
BILIRUBIN URINE: NEGATIVE
Glucose, UA: NEGATIVE mg/dL
HGB URINE DIPSTICK: NEGATIVE
Ketones, ur: 5 mg/dL — AB
LEUKOCYTES UA: NEGATIVE
NITRITE: NEGATIVE
PH: 5 (ref 5.0–8.0)
Protein, ur: 100 mg/dL — AB
SPECIFIC GRAVITY, URINE: 1.018 (ref 1.005–1.030)

## 2018-08-12 LAB — BASIC METABOLIC PANEL
Anion gap: 18 — ABNORMAL HIGH (ref 5–15)
BUN: 92 mg/dL — AB (ref 8–23)
CALCIUM: 7.5 mg/dL — AB (ref 8.9–10.3)
CO2: 29 mmol/L (ref 22–32)
CREATININE: 4.79 mg/dL — AB (ref 0.61–1.24)
Chloride: 91 mmol/L — ABNORMAL LOW (ref 98–111)
GFR calc non Af Amer: 10 mL/min — ABNORMAL LOW (ref >60)
GFR, EST AFRICAN AMERICAN: 12 mL/min — AB (ref >60)
Glucose, Bld: 87 mg/dL (ref 70–99)
Potassium: 3.5 mmol/L (ref 3.5–5.1)
SODIUM: 138 mmol/L (ref 135–145)

## 2018-08-12 LAB — PROCALCITONIN: Procalcitonin: 0.21 ng/mL

## 2018-08-12 LAB — APTT: APTT: 85 s — AB (ref 24–36)

## 2018-08-12 LAB — CORTISOL-AM, BLOOD: CORTISOL - AM: 7.9 ug/dL (ref 6.7–22.6)

## 2018-08-12 LAB — HEPARIN LEVEL (UNFRACTIONATED): HEPARIN UNFRACTIONATED: 1.62 [IU]/mL — AB (ref 0.30–0.70)

## 2018-08-12 MED ORDER — ALPRAZOLAM 0.5 MG PO TABS: 0.5000 mg | ORAL_TABLET | Freq: Two times a day (BID) | ORAL | Status: DC | PRN

## 2018-08-12 MED ORDER — HEPARIN (PORCINE) IN NACL 100-0.45 UNIT/ML-% IJ SOLN
1100.0000 [IU]/h | INTRAMUSCULAR | Status: DC
  Administered 2018-08-12 – 2018-08-13 (×2): 1150 [IU]/h via INTRAVENOUS
  Administered 2018-08-14: 1100 [IU]/h via INTRAVENOUS
  Filled 2018-08-12 (×3): qty 250

## 2018-08-12 MED ORDER — ORAL CARE MOUTH RINSE
15.0000 mL | Freq: Two times a day (BID) | OROMUCOSAL | Status: DC
  Administered 2018-08-13 – 2018-08-17 (×8): 15 mL via OROMUCOSAL

## 2018-08-12 MED ORDER — WHITE PETROLATUM EX OINT
TOPICAL_OINTMENT | CUTANEOUS | Status: AC
  Administered 2018-08-12: 0.2
  Filled 2018-08-12: qty 28.35

## 2018-08-12 MED ORDER — PREDNISONE 10 MG PO TABS
10.0000 mg | ORAL_TABLET | Freq: Two times a day (BID) | ORAL | Status: DC
  Administered 2018-08-12 – 2018-08-15 (×6): 10 mg via ORAL
  Filled 2018-08-12 (×6): qty 1

## 2018-08-12 MED ORDER — GABAPENTIN 300 MG PO CAPS: 300.0000 mg | ORAL_CAPSULE | Freq: Every day | ORAL | Status: DC
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
## 2018-08-12 NOTE — Progress Notes (Addendum)
ANTICOAGULATION CONSULT NOTE - Follow Up Consult  Pharmacy Consult for Heparin - Eliquis on hold Indication: atrial fibrillation  No Known Allergies  Vital Signs: Temp: 98 F (36.7 C) (10/05 0926) Temp Source: Axillary (10/05 0926) BP: 152/87 (10/05 0926) Pulse Rate: 66 (10/05 0926)  Labs: Recent Labs    08/11/18 1619 08/11/18 1808 08/12/18 0614  HGB 15.7  --  13.2  HCT 50.2  --  42.5  PLT 235  --  202  CREATININE 4.72* 4.85* 4.79*    Estimated Creatinine Clearance: 13.5 mL/min (A) (by C-G formula based on SCr of 4.79 mg/dL (H)).   Assessment: 84 year old male to begin heparin while Eliquis is on hold Last dose of Eliquis 10/4 AM  Goal of Therapy:  Heparin level 0.3-0.7 units/ml Monitor platelets by anticoagulation protocol: Yes  PTT = 66 to 102 seconds   Plan:  Heparin at 1150 units / hr 8 hour PTT and heparin level Daily heparin level, CBC, PTT  Thank you Okey Regal, PharmD [phone]  08/12/2018,11:11 AM   21:30 pm: PTT therapeutic, no change in heparin dose  Thank you Okey Regal, PharmD [phone]
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
## 2018-08-13 LAB — CBC
HEMATOCRIT: 38.2 % — AB (ref 39.0–52.0)
Hemoglobin: 11.8 g/dL — ABNORMAL LOW (ref 13.0–17.0)
MCH: 29.9 pg (ref 26.0–34.0)
MCHC: 30.9 g/dL (ref 30.0–36.0)
MCV: 97 fL (ref 78.0–100.0)
Platelets: 201 10*3/uL (ref 150–400)
RBC: 3.94 MIL/uL — ABNORMAL LOW (ref 4.22–5.81)
RDW: 14.7 % (ref 11.5–15.5)
WBC: 14.3 10*3/uL — ABNORMAL HIGH (ref 4.0–10.5)

## 2018-08-13 LAB — PROCALCITONIN: Procalcitonin: 0.11 ng/mL

## 2018-08-13 LAB — BASIC METABOLIC PANEL
Anion gap: 13 (ref 5–15)
BUN: 92 mg/dL — AB (ref 8–23)
CHLORIDE: 97 mmol/L — AB (ref 98–111)
CO2: 25 mmol/L (ref 22–32)
CREATININE: 3.66 mg/dL — AB (ref 0.61–1.24)
Calcium: 6.9 mg/dL — ABNORMAL LOW (ref 8.9–10.3)
GFR calc Af Amer: 16 mL/min — ABNORMAL LOW (ref >60)
GFR calc non Af Amer: 14 mL/min — ABNORMAL LOW (ref >60)
Glucose, Bld: 139 mg/dL — ABNORMAL HIGH (ref 70–99)
Potassium: 4.1 mmol/L (ref 3.5–5.1)
SODIUM: 135 mmol/L (ref 135–145)

## 2018-08-13 LAB — HEPARIN LEVEL (UNFRACTIONATED): Heparin Unfractionated: 1.66 IU/mL — ABNORMAL HIGH (ref 0.30–0.70)

## 2018-08-13 LAB — APTT: APTT: 100 s — AB (ref 24–36)

## 2018-08-13 MED ORDER — METOPROLOL TARTRATE 25 MG PO TABS
25.0000 mg | ORAL_TABLET | Freq: Two times a day (BID) | ORAL | Status: DC
  Administered 2018-08-13 – 2018-08-17 (×9): 25 mg via ORAL
  Filled 2018-08-13 (×9): qty 1

## 2018-08-13 NOTE — Progress Notes (Signed)
ANTICOAGULATION CONSULT NOTE - Follow Up Consult  Pharmacy Consult for Heparin - Eliquis on hold Indication: atrial fibrillation  No Known Allergies  Vital Signs: Temp: 97.3 F (36.3 C) (10/06 0738) Temp Source: Axillary (10/06 0738) BP: 106/62 (10/06 0738) Pulse Rate: 44 (10/06 0738)  Labs: Recent Labs    08/11/18 1619 08/11/18 1808 08/12/18 0614 08/12/18 2053 08/13/18 0440  HGB 15.7  --  13.2  --  11.8*  HCT 50.2  --  42.5  --  38.2*  PLT 235  --  202  --  201  APTT  --   --   --  85* 100*  HEPARINUNFRC  --   --   --  1.62* 1.66*  CREATININE 4.72* 4.85* 4.79*  --  3.66*    Estimated Creatinine Clearance: 17.7 mL/min (A) (by C-G formula based on SCr of 3.66 mg/dL (H)).   Assessment: 84 year old male to begin heparin while Eliquis is on hold Last dose of Eliquis 10/4 AM  PTT = 100 seconds Heparin level not yet correlating   Goal of Therapy:  Heparin level 0.3-0.7 units/ml Monitor platelets by anticoagulation protocol: Yes  PTT = 66 to 102 seconds   Plan:  Decrease heparin to 1100 units / hr Daily heparin level, CBC, PTT  Thank you Okey Regal, PharmD [phone]  08/13/2018,10:57 AM
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
## 2018-08-13 NOTE — Progress Notes (Signed)
PROGRESS NOTE    Alik Mawson [facility]  ZOX:[id number] DOB: 07-10-1933 DOA: 08/11/2018 PCP: Richardean Chimera, MD  Brief Narrative: Isaiah Brady is a 84 y.o. male with medical history significant of hypertension, chronic respiratory failure due to COPD on 2 L nasal cannula oxygen, GERD, depression with anxiety, peripheral neuropathy, psoriasis, atrial fibrillation on Eliquis, pulmonary hypertension, who presented with AKI/decreased urine output. Patient was recently hospitalized from 9/28-10/3 due to altered mental status, possible pneumonia and AKI.  Patient had creatinine 5.2, treated with CRRT, creatinine improved to 1.75 at discharge. He completed 5-day antibiotic treatment for PNA. Marland Kitchen Pt was just discharged 10/3 and readmitted 10/4 with AKI and decreased UO   Assessment & Plan:   AKI (acute kidney injury)  -Etiology is not clear, no urinary retention -Suspect could have been hemodynamically mediated, BP was 118/68 on initial eval, -Last echo with preserved EF and moderate pulmonary hypertension PA pressure 60 -Urinalysis with hyaline casts -On gentle normal saline, creatinine improving -Nephrology consulted, Renal US with medical renal dz -Renal dosing of medications, cut down gabapentin and lorazepam dose -Recent admission last week with acute kidney injury likely hemodynamically mediated and worsened by ARB, required CRRT x2 9/28 and 9/29 -monitor -ambulate, PT/OT  Leukocytosis -No clear source of infection at this time -Remains afebrile, exam without localizing infection -procalcitonin low -Leukocytosis improving with fluids  R arm weakness -ongoing for 2-45months per pt -check MRI, has Afib -risk for CVA  Atrial Fibrillation: CHA2DS2-VASc Score is 5 -He was started on Eliquis last week, now with worsening acute kidney injury this is not safe, transitioned to IV heparin -Should kidney function improved considerably can restart Eliquis -Continue metoprolol and cardizem -Recent CT  with evidence of prior strokes, on exam has right arm weakness noted, onset unclear per son, patient thinks it started 2-10months ago after he fell and injured his right shoulder, will check MRI  COPD (chronic obstructive pulmonary disease) and chronic respiratory failure with hypoxia:  -Patient is on 2 L nasal cannula oxygen at home. -stable, continue bronchodilators as needed  Depression and anxiety:  -Continue home medications:   Psoriasis: -Continue home prednisone -dose doubled due to stress  HTN:  -Continue home medications: Metoprolol, Cardizem  GERD: -Protonix  DVT prophylaxis: IV heparin Code Status: FUll Code Family Communication: son at bedside Disposition Plan: may need Rehab  Consultants:   Renal   Procedures:   Antimicrobials:    Subjective: -feels better today, no N/V -denies dyspnea  Objective: Vitals:   08/13/18 0447 08/13/18 0500 08/13/18 0732 08/13/18 0738  BP: (!) 110/51   106/62  Pulse: (!) 50   (!) 44  Resp: (!) 22   20  Temp: 97.9 F (36.6 C)   (!) 97.3 F (36.3 C)  TempSrc:    Axillary  SpO2: 98%  99% 100%  Weight:  94.4 kg    Height:        Intake/Output Summary (Last 24 hours) at 08/13/2018 1347 Last data filed at 08/13/2018 1104 Gross per 24 hour  Intake 3258.22 ml  Output 1175 ml  Net 2083.22 ml   Filed Weights   08/12/18 1508 08/12/18 2100 08/13/18 0500  Weight: 94.4 kg 94.4 kg 94.4 kg    Examination:  Gen: elderly, chronically ill male, more alert, no distress,  HEENT: PERRLA,  Lungs: poor air movement, CTAB CVS: S1S2/RRR Abd: soft, Non tender, non distended, BS present Extremities: No edema Skin: no new rashes Neuro: left arm weakness Psychiatry:  Mood & affect  appropriate.     Data Reviewed:   CBC: Recent Labs  Lab 08/07/18 0442 08/08/18 0341 08/11/18 1619 08/12/18 0614 08/13/18 0440  WBC 14.1* 11.9* 23.0* 17.9* 14.3*  NEUTROABS  --   --   --  9.8*  --   HGB 12.1* 13.1 15.7 13.2 11.8*  HCT  39.8 42.0 50.2 42.5 38.2*  MCV 96.8 95.5 96.0 95.9 97.0  PLT 164 172 235 202 201   Basic Metabolic Panel: Recent Labs  Lab 08/06/18 1546 08/07/18 0442 08/08/18 0341 08/09/18 0750 08/11/18 1619 08/11/18 1808 08/12/18 0614 08/13/18 0440  NA 138 141 140 140 132* 135 138 135  K 4.5 3.7 3.6 3.4* 6.3* 4.6 3.5 4.1  CL 99 101 98 88* 86* 86* 91* 97*  CO2 29 31 34* 34* 24 28 29 25   GLUCOSE 131* 101* 101* 125* 84 101* 87 139*  BUN 34* 33* 33* 39* 84* 86* 92* 92*  CREATININE 1.99* 1.79* 1.68* 1.75* 4.72* 4.85* 4.79* 3.66*  CALCIUM 8.0* 8.2* 8.0* 8.8* 7.7* 8.2* 7.5* 6.9*  MG  --  1.9 1.8  --   --   --   --   --   PHOS 3.2 3.0  3.0 2.5  --   --   --   --   --    GFR: Estimated Creatinine Clearance: 17.7 mL/min (A) (by C-G formula based on SCr of 3.66 mg/dL (H)). Liver Function Tests: Recent Labs  Lab 08/06/18 1546 08/07/18 0442 08/08/18 0341 08/11/18 1619 08/11/18 1808  AST  --   --   --  86* 29  ALT  --   --   --  12 24  ALKPHOS  --   --   --  62 62  BILITOT  --   --   --  2.5* 0.8  PROT  --   --   --  6.2* 6.6  ALBUMIN 2.8* 2.9* 2.8* 3.3* 3.4*   No results for input(s): LIPASE, AMYLASE in the last 168 hours. No results for input(s): AMMONIA in the last 168 hours. Coagulation Profile: No results for input(s): INR, PROTIME in the last 168 hours. Cardiac Enzymes: No results for input(s): CKTOTAL, CKMB, CKMBINDEX, TROPONINI in the last 168 hours. BNP (last 3 results) No results for input(s): PROBNP in the last 8760 hours. HbA1C: No results for input(s): HGBA1C in the last 72 hours. CBG: Recent Labs  Lab 08/06/18 1520 08/06/18 2313 08/07/18 0320 08/07/18 0749 08/07/18 1125  GLUCAP 144* 95 96 130* 125*   Lipid Profile: No results for input(s): CHOL, HDL, LDLCALC, TRIG, CHOLHDL, LDLDIRECT in the last 72 hours. Thyroid Function Tests: No results for input(s): TSH, T4TOTAL, FREET4, T3FREE, THYROIDAB in the last 72 hours. Anemia Panel: No results for input(s):  VITAMINB12, FOLATE, FERRITIN, TIBC, IRON, RETICCTPCT in the last 72 hours. Urine analysis:    Component Value Date/Time   COLORURINE AMBER (A) 08/11/2018 2316   APPEARANCEUR HAZY (A) 08/11/2018 2316   LABSPEC 1.018 08/11/2018 2316   PHURINE 5.0 08/11/2018 2316   GLUCOSEU NEGATIVE 08/11/2018 2316   HGBUR NEGATIVE 08/11/2018 2316   BILIRUBINUR NEGATIVE 08/11/2018 2316   KETONESUR 5 (A) 08/11/2018 2316   PROTEINUR 100 (A) 08/11/2018 2316   NITRITE NEGATIVE 08/11/2018 2316   LEUKOCYTESUR NEGATIVE 08/11/2018 2316   Sepsis Labs: @LABRCNTIP (procalcitonin:4,lacticidven:4)  ) Recent Results (from the past 240 hour(s))  MRSA PCR Screening     Status: None   Collection Time: 08/05/18 11:33 AM  Result Value Ref Range Status  MRSA by PCR NEGATIVE NEGATIVE Final    Comment:        The GeneXpert MRSA Assay (FDA approved for NASAL specimens only), is one component of a comprehensive MRSA colonization surveillance program. It is not intended to diagnose MRSA infection nor to guide or monitor treatment for MRSA infections. Performed at [facility], [address], [place], [place] [address]   Culture, blood (Routine X 2) w Reflex to ID Panel     Status: None (Preliminary result)   Collection Time: 08/11/18  7:52 PM  Result Value Ref Range Status   Specimen Description BLOOD RIGHT FOREARM  Final   Special Requests   Final    BOTTLES DRAWN AEROBIC AND ANAEROBIC Blood Culture adequate volume   Culture   Final    NO GROWTH < 24 HOURS Performed at [facility], [address], [place], [place] [address]    Report Status PENDING  Incomplete  Culture, blood (Routine X 2) w Reflex to ID Panel     Status: None (Preliminary result)   Collection Time: 08/11/18  7:57 PM  Result Value Ref Range Status   Specimen Description BLOOD LEFT ANTECUBITAL  Final   Special Requests   Final    BOTTLES DRAWN AEROBIC AND ANAEROBIC Blood Culture adequate volume   Culture   Final    NO GROWTH  < 24 HOURS Performed at [facility], [address] [place], [place] [address]    Report Status PENDING  Incomplete         Radiology Studies: US Renal  Result Date: 08/11/2018 CLINICAL DATA:  Acute renal disease. EXAM: RENAL / URINARY TRACT ULTRASOUND COMPLETE COMPARISON:  Ultrasound August 06, 2018 FINDINGS: Right Kidney: Length: 12.6 cm. Cortical thinning measuring 9 mm. No hydronephrosis. Left Kidney: Length: 10.2 cm. Cortical thinning measuring 9 mm. No hydronephrosis. Bladder: Appears normal for degree of bladder distention. IMPRESSION: 1. Increased cortical echogenicity and cortical thinning bilaterally. Findings are consistent medical renal disease. No acute abnormality. Electronically Signed   By: Gerome Sam III M.D   On: 08/11/2018 20:44        Scheduled Meds: . amitriptyline  50 mg Oral QHS  . diltiazem  240 mg Oral Daily  . FLUoxetine  20 mg Oral Daily  . mouth rinse  15 mL Mouth Rinse BID  . metoprolol tartrate  25 mg Oral BID  . omega-3 acid ethyl esters  1 g Oral BID AC  . pantoprazole  40 mg Oral Daily  . predniSONE  10 mg Oral BID WC  . umeclidinium-vilanterol  1 puff Inhalation Daily   Continuous Infusions: . sodium chloride 75 mL/hr at 08/13/18 0600  . heparin 1,100 Units/hr (08/13/18 1104)     LOS: 2 days    Time spent:    Zannie Cove, MD [facility] Page via [email or web address], password [email or web address] After 7PM please contact night-coverage  08/13/2018, 1:47 PM

## 2018-08-13 NOTE — Progress Notes (Signed)
Palliative care:  Consult received and chart reviewed. Spoke with patient's son, Tinnie Gens. Plan to meet with him tomorrow, 10/7, 3 pm.  Thank you for this consult.  Gerlean Ren, DNP, AGNP-C Palliative Medicine Team Team Phone # [phone]  Pager # [phone]

## 2018-08-14 ENCOUNTER — Inpatient Hospital Stay (HOSPITAL_COMMUNITY): Payer: Medicare Other

## 2018-08-14 DIAGNOSIS — Z7189 Other specified counseling: Secondary | ICD-10-CM

## 2018-08-14 DIAGNOSIS — Z515 Encounter for palliative care: Secondary | ICD-10-CM

## 2018-08-14 DIAGNOSIS — N179 Acute kidney failure, unspecified: Principal | ICD-10-CM

## 2018-08-14 LAB — BASIC METABOLIC PANEL
Anion gap: 9 (ref 5–15)
BUN: 84 mg/dL — ABNORMAL HIGH (ref 8–23)
CHLORIDE: 107 mmol/L (ref 98–111)
CO2: 24 mmol/L (ref 22–32)
Calcium: 6.8 mg/dL — ABNORMAL LOW (ref 8.9–10.3)
Creatinine, Ser: 2.61 mg/dL — ABNORMAL HIGH (ref 0.61–1.24)
GFR calc Af Amer: 25 mL/min — ABNORMAL LOW (ref >60)
GFR calc non Af Amer: 21 mL/min — ABNORMAL LOW (ref >60)
Glucose, Bld: 176 mg/dL — ABNORMAL HIGH (ref 70–99)
POTASSIUM: 4.5 mmol/L (ref 3.5–5.1)
Sodium: 140 mmol/L (ref 135–145)

## 2018-08-14 LAB — CBC
HEMATOCRIT: 34.6 % — AB (ref 39.0–52.0)
HEMOGLOBIN: 10.7 g/dL — AB (ref 13.0–17.0)
MCH: 30.2 pg (ref 26.0–34.0)
MCHC: 30.9 g/dL (ref 30.0–36.0)
MCV: 97.7 fL (ref 78.0–100.0)
Platelets: 191 10*3/uL (ref 150–400)
RBC: 3.54 MIL/uL — ABNORMAL LOW (ref 4.22–5.81)
RDW: 14.9 % (ref 11.5–15.5)
WBC: 12.6 10*3/uL — ABNORMAL HIGH (ref 4.0–10.5)

## 2018-08-14 LAB — PROCALCITONIN: Procalcitonin: <0.1 ng/mL

## 2018-08-14 LAB — APTT: APTT: 100 s — AB (ref 24–36)

## 2018-08-14 LAB — HEPARIN LEVEL (UNFRACTIONATED): Heparin Unfractionated: 1.02 IU/mL — ABNORMAL HIGH (ref 0.30–0.70)

## 2018-08-14 MED ORDER — POLYETHYLENE GLYCOL 3350 17 G PO PACK
17.0000 g | PACK | Freq: Every day | ORAL | Status: DC
  Administered 2018-08-14 – 2018-08-17 (×4): 17 g via ORAL
  Filled 2018-08-14 (×4): qty 1

## 2018-08-14 MED ORDER — SENNOSIDES-DOCUSATE SODIUM 8.6-50 MG PO TABS
1.0000 | ORAL_TABLET | Freq: Two times a day (BID) | ORAL | Status: DC
  Administered 2018-08-14 – 2018-08-15 (×4): 1 via ORAL
  Filled 2018-08-14 (×4): qty 1

## 2018-08-14 NOTE — Progress Notes (Signed)
Vascular lab progress note:  Attempted to get patient for renal artery duplex. Patient is not NPO. Was supposed to be NPO per RN Synetta Fail.  Will now try to get at 3pm today and have lunch tray held per [employer].  Farrel Demark, RDMS, RVT
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
## 2018-08-14 NOTE — Progress Notes (Signed)
ANTICOAGULATION CONSULT NOTE - Follow Up Consult  Pharmacy Consult for Heparin - Eliquis on hold Indication: atrial fibrillation  No Known Allergies  Vital Signs: Temp: 97.8 F (36.6 C) (10/07 0413) Temp Source: Oral (10/07 0413) BP: 112/48 (10/07 0413) Pulse Rate: 56 (10/07 0413)  Labs: Recent Labs    08/12/18 0614 08/12/18 2053 08/13/18 0440 08/14/18 0433  HGB 13.2  --  11.8* 10.7*  HCT 42.5  --  38.2* 34.6*  PLT 202  --  201 191  APTT  --  85* 100* 100*  HEPARINUNFRC  --  1.62* 1.66* 1.02*  CREATININE 4.79*  --  3.66* 2.61*    Estimated Creatinine Clearance: 24.8 mL/min (A) (by C-G formula based on SCr of 2.61 mg/dL (H)).   Assessment: 84 year old male to begin heparin while Eliquis is on hold Last dose of Eliquis 10/4 AM  PTT = 100 seconds Heparin level not yet correlating   Goal of Therapy:  Heparin level 0.3-0.7 units/ml Monitor platelets by anticoagulation protocol: Yes  PTT = 66 to 102 seconds   Plan:  Continue heparin at 1100 units / hr Daily heparin level, CBC, PTT  Zurii Hewes A. Jeanella Craze, PharmD, BCPS Clinical Pharmacist [place] Pager: [phone] Please utilize Amion for appropriate phone number to reach the unit pharmacist [facility])   08/14/2018,7:27 AM

## 2018-08-14 NOTE — Progress Notes (Signed)
[facility]  Patient Status: Active (receiving services up to time of hospitalization)  [facility] is providing the following services: RN, PT and OT  If patient discharges after hours, please call [phone].   Isaiah Brady 08/14/2018, 11:41 AM

## 2018-08-14 NOTE — Progress Notes (Signed)
Renal artery duplex completed. No obvious RAS Farrel Demark, RDMS, RVT

## 2018-08-14 NOTE — Progress Notes (Signed)
[facility] Progress Note  Subjective:  AFebrile, BPs 110s-140s SBP / 40-60s.  On 2L 98% FIO2  0.8L UOP yesterday,   SCr down to 2.6 from 3.6 yesterday, K 4.5  Awake, alert this AM  Vitals:   08/13/18 2111 08/14/18 0413 08/14/18 0906 08/14/18 0918  BP: (!) 115/45 (!) 112/48 (!) 141/67   Pulse: (!) 55 (!) 56 (!) 57   Resp: 20 19 19    Temp: 98 F (36.7 C) 97.8 F (36.6 C)    TempSrc: Oral Oral    SpO2: 97% 100% 98% 98%  Weight:      Height:        Inpatient medications: . amitriptyline  50 mg Oral QHS  . diltiazem  240 mg Oral Daily  . FLUoxetine  20 mg Oral Daily  . mouth rinse  15 mL Mouth Rinse BID  . metoprolol tartrate  25 mg Oral BID  . omega-3 acid ethyl esters  1 g Oral BID AC  . pantoprazole  40 mg Oral Daily  . predniSONE  10 mg Oral BID WC  . umeclidinium-vilanterol  1 puff Inhalation Daily   . sodium chloride 75 mL/hr at 08/14/18 0600  . heparin 1,100 Units/hr (08/14/18 0600)   acetaminophen **OR** acetaminophen, albuterol, dextromethorphan-guaiFENesin, ondansetron **OR** ondansetron (ZOFRAN) IV, senna-docusate  Iron/TIBC/Ferritin/ %Sat No results found for: IRON, TIBC, FERRITIN, IRONPCTSAT  Exam: Gen somnolent but awakens and mostly oriented, no asterixis No jvd or bruits Chest clear bilat to bases RRR no MRG Abd soft ntnd no mass or ascites +bs GU normal male w foley in place MS no joint effusions or deformity Ext no LE or UE edema Neuro lethargic but awakens and follows commands, converses   UA 9/28 - >50 rbc/ 11-20 wbc per hpf, rare bact, wbc clumps, prot neg  UA 10/2 - negative  UA 10/4 - negative  10/4 > UNa 22, UCreat 251  Renal US 9/29 > L kidney 10.8 cm echogenic parenchyma, cortical thinning, no hydro +atrophy // R kidney 12.9 cm echogenic parenchyma, cortical thinning, no hydro  Renal US 10/4 > Increased cortical echogenicity and cortical thinning bilaterally. Findings are consistent medical renal disease. No acute  abnormality.                                           Home meds:  - diltiazem 240 qd/ metoprolol tartrate 50 bid  - alprazolam 1 bid / fluoxetine 20 qd/ gabapentin 300 tid/ tramadol prn/ amitriptyline 50 hs  - umeclidinium-volanterol 1 puff daily/ prednisone 5 bid  - alendronate/ omeprazole 40 qd  - apixaban 2.5 bid     Impression: 1. Renal failure - not sure baseline creat (best guess 1.7-2.0), last admit dc'd Cr 5.2 >> 1.75 at dc.  Admitted with recurrent renal failure.  Suspect due to restarting losartan (ARB) by mistake at home and/or dehydration (low FeNa). Improving quickly on IVF"s now, BP's good and holding ARB again.  Needs PT/OT.    2. HTN - BP's stable in past 24h 3. COPD 4. Atrial fib - in NSR now, on dilt / metop/ eliquis at home started recently. IV hep here.  5. DNR - family request 6.   Progressive debility: palliative to see   Sabra Heck MD [facility]  08/14/2018, 9:43 AM   Recent Labs  Lab 08/08/18 [phone]  08/11/18 1619 08/11/18 1808  08/13/18  0440 08/14/18 0433  NA 140   < > 132* 135   < > 135 140  K 3.6   < > 6.3* 4.6   < > 4.1 4.5  CL 98   < > 86* 86*   < > 97* 107  CO2 34*   < > 24 28   < > 25 24  GLUCOSE 101*   < > 84 101*   < > 139* 176*  BUN 33*   < > 84* 86*   < > 92* 84*  CREATININE 1.68*   < > 4.72* 4.85*   < > 3.66* 2.61*  CALCIUM 8.0*   < > 7.7* 8.2*   < > 6.9* 6.8*  PHOS 2.5  --   --   --   --   --   --   ALBUMIN 2.8*  --  3.3* 3.4*  --   --   --    < > = values in this interval not displayed.   Recent Labs  Lab 08/11/18 1619 08/11/18 1808  AST 86* 29  ALT 12 24  ALKPHOS 62 62  BILITOT 2.5* 0.8  PROT 6.2* 6.6   Recent Labs  Lab 08/12/18 0614 08/13/18 0440 08/14/18 0433  WBC 17.9* 14.3* 12.6*  NEUTROABS 9.8*  --   --   HGB 13.2 11.8* 10.7*  HCT 42.5 38.2* 34.6*  MCV 95.9 97.0 97.7  PLT 202 201 191
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
## 2018-08-14 NOTE — Progress Notes (Addendum)
PROGRESS NOTE    Isaiah Brady [facility]  ZOX:[id number] DOB: Jan 30, 1933 DOA: 08/11/2018 PCP: Richardean Chimera, MD  Brief Narrative: Isaiah Brady is a 84 y.o. male with medical history significant of hypertension, chronic respiratory failure due to COPD on 2 L nasal cannula oxygen, GERD, depression with anxiety, peripheral neuropathy, psoriasis, atrial fibrillation on Eliquis, pulmonary hypertension, who presented with AKI/decreased urine output. Patient was recently hospitalized from 9/28-10/3 due to altered mental status, possible pneumonia and AKI.  Patient had creatinine 5.2, treated with CRRT, creatinine improved to 1.75 at discharge. He completed 5-day antibiotic treatment for PNA. Marland Kitchen Pt was just discharged 10/3 and readmitted 10/4 with AKI and decreased UO   Assessment & Plan:   AKI (acute kidney injury)  -Etiology is not clear, no urinary retention -Suspect could have been hemodynamically mediated, BP was 118/68 on initial eval, -Last echo with preserved EF and moderate pulmonary hypertension PA pressure 60 -Urinalysis with hyaline casts, then improving with gentle hydration down to 2.6 today -Renal US with medical renal dz -Nephrology consult appreciated -Renal dosing of medications, cut down gabapentin and lorazepam dose -Recent admission last week with acute kidney injury likely hemodynamically mediated and worsened by ARB, required CRRT x2 9/28 and 9/29 -Ambulate, physical therapy  Leukocytosis -No clear source of infection at this time -Remains afebrile, exam without localizing infection -procalcitonin low -Leukocytosis improving with fluids, suggesting dehydration  R arm weakness -ongoing for 2-43months per pt -MRI brain still pending, has Afib -risk for CVA  Atrial Fibrillation: CHA2DS2-VASc Score is 5 -He was started on Eliquis last week, now with worsening acute kidney injury this is not safe, switched over to IV heparin -If creatinine continues to improve transition to oral  Eliquis tomorrow -Continue metoprolol and cardizem -Recent CT with evidence of prior strokes, on exam has right arm weakness noted, onset unclear per son, patient thinks it started 2-60months ago after he fell and injured his right shoulder, awaiting MRI  COPD (chronic obstructive pulmonary disease) and chronic respiratory failure with hypoxia:  -Patient is on 2 L nasal cannula oxygen at home. -stable, continue bronchodilators as needed  Depression and anxiety:  -Continue home medications:   Psoriasis: -Continue home prednisone -dose doubled due to stress  HTN:  -Continue home medications: Metoprolol, Cardizem  GERD: -Protonix  DVT prophylaxis: IV heparin Code Status: FUll Code Family Communication: son at bedside Disposition Plan: HOme with [facility] services  Consultants:   Renal   Procedures:   Antimicrobials:    Subjective: -Feels okay, denies any dyspnea or shortness of breath, constipated  Objective: Vitals:   08/13/18 2111 08/14/18 0413 08/14/18 0906 08/14/18 0918  BP: (!) 115/45 (!) 112/48 (!) 141/67   Pulse: (!) 55 (!) 56 (!) 57   Resp: 20 19 19    Temp: 98 F (36.7 C) 97.8 F (36.6 C) 97.6 F (36.4 C)   TempSrc: Oral Oral Oral   SpO2: 97% 100% 98% 98%  Weight:      Height:        Intake/Output Summary (Last 24 hours) at 08/14/2018 1403 Last data filed at 08/14/2018 1133 Gross per 24 hour  Intake 2151.67 ml  Output 1475 ml  Net 676.67 ml   Filed Weights   08/12/18 1508 08/12/18 2100 08/13/18 0500  Weight: 94.4 kg 94.4 kg 94.4 kg    Examination:  Gen: Elderly frail, chronically ill male sitting up in bed, alert awake oriented x2 HEENT: PERRLA, Neck supple, no JVD Lungs: Decreased breath sounds at both bases  CVS: S1-S2/regular rate rhythm Abd: soft, Non tender, non distended, BS present Extremities: No edema Skin: no new rashes Neuro: left arm weakness Psychiatry:  Mood & affect appropriate.     Data Reviewed:   CBC: Recent Labs    Lab 08/08/18 0341 08/11/18 1619 08/12/18 0614 08/13/18 0440 08/14/18 0433  WBC 11.9* 23.0* 17.9* 14.3* 12.6*  NEUTROABS  --   --  9.8*  --   --   HGB 13.1 15.7 13.2 11.8* 10.7*  HCT 42.0 50.2 42.5 38.2* 34.6*  MCV 95.5 96.0 95.9 97.0 97.7  PLT 172 235 202 201 191   Basic Metabolic Panel: Recent Labs  Lab 08/08/18 0341  08/11/18 1619 08/11/18 1808 08/12/18 0614 08/13/18 0440 08/14/18 0433  NA 140   < > 132* 135 138 135 140  K 3.6   < > 6.3* 4.6 3.5 4.1 4.5  CL 98   < > 86* 86* 91* 97* 107  CO2 34*   < > 24 28 29 25 24   GLUCOSE 101*   < > 84 101* 87 139* 176*  BUN 33*   < > 84* 86* 92* 92* 84*  CREATININE 1.68*   < > 4.72* 4.85* 4.79* 3.66* 2.61*  CALCIUM 8.0*   < > 7.7* 8.2* 7.5* 6.9* 6.8*  MG 1.8  --   --   --   --   --   --   PHOS 2.5  --   --   --   --   --   --    < > = values in this interval not displayed.   GFR: Estimated Creatinine Clearance: 24.8 mL/min (A) (by C-G formula based on SCr of 2.61 mg/dL (H)). Liver Function Tests: Recent Labs  Lab 08/08/18 0341 08/11/18 1619 08/11/18 1808  AST  --  86* 29  ALT  --  12 24  ALKPHOS  --  62 62  BILITOT  --  2.5* 0.8  PROT  --  6.2* 6.6  ALBUMIN 2.8* 3.3* 3.4*   No results for input(s): LIPASE, AMYLASE in the last 168 hours. No results for input(s): AMMONIA in the last 168 hours. Coagulation Profile: No results for input(s): INR, PROTIME in the last 168 hours. Cardiac Enzymes: No results for input(s): CKTOTAL, CKMB, CKMBINDEX, TROPONINI in the last 168 hours. BNP (last 3 results) No results for input(s): PROBNP in the last 8760 hours. HbA1C: No results for input(s): HGBA1C in the last 72 hours. CBG: No results for input(s): GLUCAP in the last 168 hours. Lipid Profile: No results for input(s): CHOL, HDL, LDLCALC, TRIG, CHOLHDL, LDLDIRECT in the last 72 hours. Thyroid Function Tests: No results for input(s): TSH, T4TOTAL, FREET4, T3FREE, THYROIDAB in the last 72 hours. Anemia Panel: No results for  input(s): VITAMINB12, FOLATE, FERRITIN, TIBC, IRON, RETICCTPCT in the last 72 hours. Urine analysis:    Component Value Date/Time   COLORURINE AMBER (A) 08/11/2018 2316   APPEARANCEUR HAZY (A) 08/11/2018 2316   LABSPEC 1.018 08/11/2018 2316   PHURINE 5.0 08/11/2018 2316   GLUCOSEU NEGATIVE 08/11/2018 2316   HGBUR NEGATIVE 08/11/2018 2316   BILIRUBINUR NEGATIVE 08/11/2018 2316   KETONESUR 5 (A) 08/11/2018 2316   PROTEINUR 100 (A) 08/11/2018 2316   NITRITE NEGATIVE 08/11/2018 2316   LEUKOCYTESUR NEGATIVE 08/11/2018 2316   Sepsis Labs: @LABRCNTIP (procalcitonin:4,lacticidven:4)  ) Recent Results (from the past 240 hour(s))  MRSA PCR Screening     Status: None   Collection Time: 08/05/18 11:33 AM  Result Value Ref Range  Status   MRSA by PCR NEGATIVE NEGATIVE Final    Comment:        The GeneXpert MRSA Assay (FDA approved for NASAL specimens only), is one component of a comprehensive MRSA colonization surveillance program. It is not intended to diagnose MRSA infection nor to guide or monitor treatment for MRSA infections. Performed at [facility], [address], [place], [place] [address]   Culture, blood (Routine X 2) w Reflex to ID Panel     Status: None (Preliminary result)   Collection Time: 08/11/18  7:52 PM  Result Value Ref Range Status   Specimen Description BLOOD RIGHT FOREARM  Final   Special Requests   Final    BOTTLES DRAWN AEROBIC AND ANAEROBIC Blood Culture adequate volume   Culture   Final    NO GROWTH 3 DAYS Performed at [facility], [address], [place], [place] [address]    Report Status PENDING  Incomplete  Culture, blood (Routine X 2) w Reflex to ID Panel     Status: None (Preliminary result)   Collection Time: 08/11/18  7:57 PM  Result Value Ref Range Status   Specimen Description BLOOD LEFT ANTECUBITAL  Final   Special Requests   Final    BOTTLES DRAWN AEROBIC AND ANAEROBIC Blood Culture adequate volume   Culture   Final    NO  GROWTH 3 DAYS Performed at [facility], [address], [place], [place] [address]    Report Status PENDING  Incomplete         Radiology Studies: No results found.      Scheduled Meds: . amitriptyline  50 mg Oral QHS  . diltiazem  240 mg Oral Daily  . FLUoxetine  20 mg Oral Daily  . mouth rinse  15 mL Mouth Rinse BID  . metoprolol tartrate  25 mg Oral BID  . omega-3 acid ethyl esters  1 g Oral BID AC  . pantoprazole  40 mg Oral Daily  . polyethylene glycol  17 g Oral Daily  . predniSONE  10 mg Oral BID WC  . senna-docusate  1 tablet Oral BID  . umeclidinium-vilanterol  1 puff Inhalation Daily   Continuous Infusions: . sodium chloride 75 mL/hr at 08/14/18 1139  . heparin 1,100 Units/hr (08/14/18 1138)     LOS: 3 days    Time spent:    Zannie Cove, MD [facility] Page via [email or web address], password [email or web address] After 7PM please contact night-coverage  08/14/2018, 2:03 PM

## 2018-08-15 LAB — CBC
HEMATOCRIT: 36.8 % — AB (ref 39.0–52.0)
Hemoglobin: 11.2 g/dL — ABNORMAL LOW (ref 13.0–17.0)
MCH: 29.6 pg (ref 26.0–34.0)
MCHC: 30.4 g/dL (ref 30.0–36.0)
MCV: 97.4 fL (ref 80.0–100.0)
Platelets: 204 10*3/uL (ref 150–400)
RBC: 3.78 MIL/uL — AB (ref 4.22–5.81)
RDW: 15.4 % (ref 11.5–15.5)
WBC: 14.1 10*3/uL — AB (ref 4.0–10.5)

## 2018-08-15 LAB — APTT: APTT: 92 s — AB (ref 24–36)

## 2018-08-15 LAB — BASIC METABOLIC PANEL
ANION GAP: 6 (ref 5–15)
BUN: 66 mg/dL — ABNORMAL HIGH (ref 8–23)
CO2: 28 mmol/L (ref 22–32)
Calcium: 7.3 mg/dL — ABNORMAL LOW (ref 8.9–10.3)
Chloride: 110 mmol/L (ref 98–111)
Creatinine, Ser: 1.75 mg/dL — ABNORMAL HIGH (ref 0.61–1.24)
GFR, EST AFRICAN AMERICAN: 40 mL/min — AB (ref >60)
GFR, EST NON AFRICAN AMERICAN: 34 mL/min — AB (ref >60)
GLUCOSE: 138 mg/dL — AB (ref 70–99)
POTASSIUM: 4.8 mmol/L (ref 3.5–5.1)
Sodium: 144 mmol/L (ref 135–145)

## 2018-08-15 LAB — HEPARIN LEVEL (UNFRACTIONATED): Heparin Unfractionated: 1.1 IU/mL — ABNORMAL HIGH (ref 0.30–0.70)

## 2018-08-15 MED ORDER — PREDNISONE 5 MG PO TABS
5.0000 mg | ORAL_TABLET | Freq: Two times a day (BID) | ORAL | Status: DC
  Administered 2018-08-15 – 2018-08-17 (×4): 5 mg via ORAL
  Filled 2018-08-15 (×4): qty 1

## 2018-08-15 MED ORDER — APIXABAN 2.5 MG PO TABS
2.5000 mg | ORAL_TABLET | Freq: Two times a day (BID) | ORAL | Status: DC
  Administered 2018-08-15 – 2018-08-16 (×4): 2.5 mg via ORAL
  Filled 2018-08-15 (×4): qty 1

## 2018-08-15 NOTE — Progress Notes (Signed)
[facility] Progress Note  Subjective:  Excellent urine output yesterday, at least 2 L  Serum creatinine improved to 1.75, potassium 4.8  Vitals:   08/14/18 2112 08/15/18 0500 08/15/18 0818 08/15/18 0851  BP: (!) 153/73 135/68 (!) 141/58   Pulse: 65 (!) 56 (!) 52   Resp: 18 18 18    Temp: 97.6 F (36.4 C) 98.5 F (36.9 C) (!) 97.5 F (36.4 C)   TempSrc: Oral  Oral   SpO2: 100% 100% 100% 100%  Weight:      Height:        Inpatient medications: . amitriptyline  50 mg Oral QHS  . apixaban  2.5 mg Oral BID  . diltiazem  240 mg Oral Daily  . FLUoxetine  20 mg Oral Daily  . mouth rinse  15 mL Mouth Rinse BID  . metoprolol tartrate  25 mg Oral BID  . omega-3 acid ethyl esters  1 g Oral BID AC  . pantoprazole  40 mg Oral Daily  . polyethylene glycol  17 g Oral Daily  . predniSONE  5 mg Oral BID WC  . senna-docusate  1 tablet Oral BID  . umeclidinium-vilanterol  1 puff Inhalation Daily    acetaminophen **OR** acetaminophen, albuterol, dextromethorphan-guaiFENesin, ondansetron **OR** ondansetron (ZOFRAN) IV  Iron/TIBC/Ferritin/ %Sat No results found for: IRON, TIBC, FERRITIN, IRONPCTSAT  Exam: Gen  awake and alert, no distress No jvd or bruits Chest clear bilat to bases RRR no MRG Abd soft ntnd no mass or ascites +bs GU normal male w foley in place MS no joint effusions or deformity Ext no LE or UE edema   UA 9/28 - >50 rbc/ 11-20 wbc per hpf, rare bact, wbc clumps, prot neg  UA 10/2 - negative  UA 10/4 - negative  10/4 > UNa 22, UCreat 251  Renal US 9/29 > L kidney 10.8 cm echogenic parenchyma, cortical thinning, no hydro +atrophy // R kidney 12.9 cm echogenic parenchyma, cortical thinning, no hydro  Renal US 10/4 > Increased cortical echogenicity and cortical thinning bilaterally. Findings are consistent medical renal disease. No acute abnormality.                                           Home meds:  - diltiazem 240 qd/ metoprolol tartrate 50  bid  - alprazolam 1 bid / fluoxetine 20 qd/ gabapentin 300 tid/ tramadol prn/ amitriptyline 50 hs  - umeclidinium-volanterol 1 puff daily/ prednisone 5 bid  - alendronate/ omeprazole 40 qd  - apixaban 2.5 bid     Impression: 1. Resolved acute renal failure -appears to have returned to recent baseline creat (best guess 1.7-2.0), last admit dc'd Cr 5.2 >> 1.75 at dc.  Continue to hold ARB, should not be resumed.  Patient instructed of this today.  No further suggestions at the current time. 1. HTN - BP's stable in past 24h 2. COPD 3. Atrial fib - in NSR now, on dilt / metop/ eliquis at home  4. DNR - family request 6.   Progressive debility: Palliative care following  Will sign off for now.  Please call with any questions or concerns.  Pt does not need follow up with nephrology.     Sabra Heck MD [facility]  08/15/2018, 11:18 AM   Recent Labs  Lab 08/11/18 1619 08/11/18 1808  08/14/18 0433 08/15/18 0534  NA 132* 135   < >  140 144  K 6.3* 4.6   < > 4.5 4.8  CL 86* 86*   < > 107 110  CO2 24 28   < > 24 28  GLUCOSE 84 101*   < > 176* 138*  BUN 84* 86*   < > 84* 66*  CREATININE 4.72* 4.85*   < > 2.61* 1.75*  CALCIUM 7.7* 8.2*   < > 6.8* 7.3*  ALBUMIN 3.3* 3.4*  --   --   --    < > = values in this interval not displayed.   Recent Labs  Lab 08/11/18 1619 08/11/18 1808  AST 86* 29  ALT 12 24  ALKPHOS 62 62  BILITOT 2.5* 0.8  PROT 6.2* 6.6   Recent Labs  Lab 08/12/18 0614  08/14/18 0433 08/15/18 0534  WBC 17.9*   < > 12.6* 14.1*  NEUTROABS 9.8*  --   --   --   HGB 13.2   < > 10.7* 11.2*  HCT 42.5   < > 34.6* 36.8*  MCV 95.9   < > 97.7 97.4  PLT 202   < > 191 204   < > = values in this interval not displayed.

## 2018-08-15 NOTE — Progress Notes (Signed)
Physical Therapy Treatment Patient Details Name: Isaiah Brady MRN: [id number] DOB: 09/13/1933 Today's Date: 08/15/2018    History of Present Illness Pt is an 84 y.o. male admitted 08/05/18 with increasing falls; found to have AKI on CKD III. Also with acute respiratory, treated for CPAP; encephalopathy likely metabolic from uremia; new onset a-fib. CT showed now acute intracranial abnormality; chronic ischemic changes with R basal ganglia lacunar stroke. PMH includes COPD, HTN, peripheral neuropathy.    PT Comments    Continuing work on functional mobility and activity tolerance;  Son present for session; Still unsteady with amb and with significantly slow gait; Good use of RW for steadiness with amb; Discussed with pt and son -- family cannot provide 24 hour assist, and so will update dc plan to SNF for post-acute rehab to maximize independence and safety with mobility prior to dc home; Pt voiced disappointment (he would rather go straight home), but agrees to short-term SNF  Follow Up Recommendations  SNF;Supervision/Assistance - 24 hour;Other (comment)(Family unable to provide 24 hour assist)     Equipment Recommendations  None recommended by PT    Recommendations for Other Services       Precautions / Restrictions Precautions Precautions: Fall    Mobility  Bed Mobility Overal bed mobility: Needs Assistance Bed Mobility: Supine to Sit     Supine to sit: Min assist     General bed mobility comments: Min handheld assist to pull to sit  Transfers Overall transfer level: Needs assistance Equipment used: Rolling walker (2 wheeled) Transfers: Sit to/from Stand Sit to Stand: Min assist         General transfer comment: Cues for hand placement and safety, as well as to control descent to sit; min assist to steady   Ambulation/Gait Ambulation/Gait assistance: Min guard Gait Distance (Feet): (Hallway ambulation) Assistive device: Rolling walker (2 wheeled) Gait  Pattern/deviations: Step-through pattern;Decreased stride length;Trunk flexed Gait velocity: Decreased   General Gait Details: Slow pace, good use of RW for balance and steadiness; cues to self-monitor for activity tolerance; walked on supplemental O2, 2 L   Stairs             Wheelchair Mobility    Modified Rankin (Stroke Patients Only)       Balance     Sitting balance-[name] Scale: Good       Standing balance-[name] Scale: Fair                              Cognition Arousal/Alertness: Awake/alert Behavior During Therapy: WFL for tasks assessed/performed Overall Cognitive Status: Within Functional Limits for tasks assessed                                        Exercises      General Comments General comments (skin integrity, edema, etc.): walked on 2 L supplemental O2, and spot check, O2 sats 100%      Pertinent Vitals/Pain Pain Assessment: No/denies pain    Home Living                      Prior Function            PT Goals (current goals can now be found in the care plan section) Acute Rehab PT Goals Patient Stated Goal: Return home PT Goal Formulation: With patient Time For Goal  Achievement: 08/28/18 Potential to Achieve Goals: Good Progress towards PT goals: Progressing toward goals    Frequency    Min 3X/week      PT Plan Discharge plan needs to be updated    Co-evaluation              AM-PAC PT "6 Clicks" Daily Activity  Outcome Measure  Difficulty turning over in bed (including adjusting bedclothes, sheets and blankets)?: A Little Difficulty moving from lying on back to sitting on the side of the bed? : A Little Difficulty sitting down on and standing up from a chair with arms (e.g., wheelchair, bedside commode, etc,.)?: A Little Help needed moving to and from a bed to chair (including a wheelchair)?: A Little Help needed walking in hospital room?: A Little Help needed climbing 3-5  steps with a railing? : A Lot 6 Click Score: 17    End of Session Equipment Utilized During Treatment: Gait belt;Oxygen Activity Tolerance: Patient tolerated treatment well Patient left: in chair;with call bell/phone within reach;with chair alarm set;with family/visitor present Nurse Communication: Mobility status PT Visit Diagnosis: Other abnormalities of gait and mobility (R26.89)     Time: 0981-1914 PT Time Calculation (min) (ACUTE ONLY): 30 min  Charges:  $Gait Training: 23-37 mins                     Van Clines, Isaiah Brady  Acute Rehabilitation Services Pager [phone] Office [phone]    Isaiah Brady 08/15/2018, 3:44 PM

## 2018-08-15 NOTE — Progress Notes (Signed)
ANTICOAGULATION CONSULT NOTE - Follow Up Consult  Pharmacy Consult for Apixaban Indication: atrial fibrillation  No Known Allergies  Vital Signs: Temp: 97.5 F (36.4 C) (10/08 0818) Temp Source: Oral (10/08 0818) BP: 141/58 (10/08 0818) Pulse Rate: 52 (10/08 0818)  Labs: Recent Labs    08/12/18 2053  08/13/18 0440 08/14/18 0433 08/15/18 0534  HGB  --    < > 11.8* 10.7* 11.2*  HCT  --   --  38.2* 34.6* 36.8*  PLT  --   --  201 191 204  APTT 85*  --  100* 100*  --   HEPARINUNFRC 1.62*  --  1.66* 1.02* 1.10*  CREATININE  --   --  3.66* 2.61* 1.75*   < > = values in this interval not displayed.    Estimated Creatinine Clearance: 36.9 mL/min (A) (by C-G formula based on SCr of 1.75 mg/dL (H)).   Assessment: 84 year old male to restart apixaban.  Scr still 1.75 and age >55, therefore will restart at adjusted dose.    Goal of Therapy:  Monitor platelets by anticoagulation protocol: Yes     Plan:  Start apixaban 2.5mg  PO BID this AM  Discontinue heparin once first dose is given  Issacc Merlo A. Jeanella Craze, PharmD, BCPS Clinical Pharmacist [place] Pager: [phone] Please utilize Amion for appropriate phone number to reach the unit pharmacist [facility])   08/15/2018,9:10 AM

## 2018-08-15 NOTE — Progress Notes (Signed)
PROGRESS NOTE    Jakhari Space [facility]  RUE:[id number] DOB: Mar 21, 1933 DOA: 08/11/2018 PCP: Richardean Chimera, MD  Brief Narrative: RODGER GIANGREGORIO is a 84 y.o. male with medical history significant of hypertension, chronic respiratory failure due to COPD on 2 L nasal cannula oxygen, GERD, depression with anxiety, peripheral neuropathy, psoriasis, atrial fibrillation on Eliquis, pulmonary hypertension, who presented with AKI/decreased urine output. Patient was recently hospitalized from 9/28-10/3 due to altered mental status, possible pneumonia and AKI.  Patient had creatinine 5.2, treated with CRRT, creatinine improved to 1.75 at discharge. He completed 5-day antibiotic treatment for PNA. Marland Kitchen Pt was just discharged 10/3 and readmitted 10/4 with AKI and decreased UO -hydrated, creatinine improving, nephrology following  Assessment & Plan:   AKI (acute kidney injury)  -Etiology is not clear, no urinary retention,  it is suspected that he may have taken losartan by mistake which was discontinued last week -appreciate Renal input -Last echo with preserved EF and moderate pulmonary hypertension PA pressure 60 -Urinalysis with hyaline casts, then improving with gentle hydration down from 4.8 to to 1 7 today, discontinue IV fluids -Renal US with medical renal dz, Renal artery duplex with no obvious renal artery stenosis -Renal dosing of medications, cut down gabapentin and lorazepam dose -Recent admission last week with acute kidney injury likely hemodynamically mediated and worsened by ARB, required CRRT x2 9/28 and 9/29 -Ambulate, physical therapy -Given recent quick readmission in 24 hours for acute renal failure will watch off fluids for an additional day  Leukocytosis -No clear source of infection at this time -Remains afebrile, exam without localizing infection -procalcitonin low -Leukocytosis improving   R arm weakness -ongoing for 2-88months per pt -MRI brain noted severe chronic microvascular  ischemia, no acute findings, has Afib -risk for CVA  Atrial Fibrillation: CHA2DS2-VASc Score is 5 -He was started on Eliquis last week, but  with worsening acute kidney injury this is switched over to IV heparin -Given improvement in kidney function will transition from heparin to oral Eliquis today  -Continue metoprolol and cardizem -Recent CT with evidence of prior strokes, on exam has right arm weakness noted, onset unclear per son, patient thinks it started 2-17months ago after he fell and injured his right shoulder, MRI brain without acute findings  COPD (chronic obstructive pulmonary disease) and chronic respiratory failure with hypoxia:  -Patient is on 2 L nasal cannula oxygen at home. -stable, continue bronchodilators as needed  Depression and anxiety:  -Continue home medications:   Psoriasis: -Continue home prednisone -Transition back to basal dose  HTN:  -Continue home medications: Metoprolol, Cardizem  GERD: -Protonix  DVT prophylaxis: Oral Eliquis Code Status: FUll Code Family Communication: son at bedside Disposition Plan: HOme with HH services versus short-term rehab  Consultants:   Renal  Palliative medicine   Procedures:   Antimicrobials:    Subjective: -Feels better today, denies any chest pain dyspnea nausea or vomiting  Objective: Vitals:   08/14/18 2112 08/15/18 0500 08/15/18 0818 08/15/18 0851  BP: (!) 153/73 135/68 (!) 141/58   Pulse: 65 (!) 56 (!) 52   Resp: 18 18 18    Temp: 97.6 F (36.4 C) 98.5 F (36.9 C) (!) 97.5 F (36.4 C)   TempSrc: Oral  Oral   SpO2: 100% 100% 100% 100%  Weight:      Height:        Intake/Output Summary (Last 24 hours) at 08/15/2018 1111 Last data filed at 08/15/2018 1000 Gross per 24 hour  Intake 2545.35 ml  Output 1600 ml  Net 945.35 ml   Filed Weights   08/12/18 1508 08/12/18 2100 08/13/18 0500  Weight: 94.4 kg 94.4 kg 94.4 kg    Examination:  Gen: Elderly frail, chronically ill male  sitting up in bed, no distress, alert awake oriented x2  HEENT: PERRLA, Neck supple, no JVD Lungs: Movement, decreased breath sounds at both bases CVS: RRR,No Gallops,Rubs or new Murmurs Abd: soft, Non tender, non distended, BS present Extremities: no Edema Skin: no new rashes    Data Reviewed:   CBC: Recent Labs  Lab 08/11/18 1619 08/12/18 0614 08/13/18 0440 08/14/18 0433 08/15/18 0534  WBC 23.0* 17.9* 14.3* 12.6* 14.1*  NEUTROABS  --  9.8*  --   --   --   HGB 15.7 13.2 11.8* 10.7* 11.2*  HCT 50.2 42.5 38.2* 34.6* 36.8*  MCV 96.0 95.9 97.0 97.7 97.4  PLT 235 202 201 191 204   Basic Metabolic Panel: Recent Labs  Lab 08/11/18 1808 08/12/18 0614 08/13/18 0440 08/14/18 0433 08/15/18 0534  NA 135 138 135 140 144  K 4.6 3.5 4.1 4.5 4.8  CL 86* 91* 97* 107 110  CO2 28 29 25 24 28   GLUCOSE 101* 87 139* 176* 138*  BUN 86* 92* 92* 84* 66*  CREATININE 4.85* 4.79* 3.66* 2.61* 1.75*  CALCIUM 8.2* 7.5* 6.9* 6.8* 7.3*   GFR: Estimated Creatinine Clearance: 36.9 mL/min (A) (by C-G formula based on SCr of 1.75 mg/dL (H)). Liver Function Tests: Recent Labs  Lab 08/11/18 1619 08/11/18 1808  AST 86* 29  ALT 12 24  ALKPHOS 62 62  BILITOT 2.5* 0.8  PROT 6.2* 6.6  ALBUMIN 3.3* 3.4*   No results for input(s): LIPASE, AMYLASE in the last 168 hours. No results for input(s): AMMONIA in the last 168 hours. Coagulation Profile: No results for input(s): INR, PROTIME in the last 168 hours. Cardiac Enzymes: No results for input(s): CKTOTAL, CKMB, CKMBINDEX, TROPONINI in the last 168 hours. BNP (last 3 results) No results for input(s): PROBNP in the last 8760 hours. HbA1C: No results for input(s): HGBA1C in the last 72 hours. CBG: No results for input(s): GLUCAP in the last 168 hours. Lipid Profile: No results for input(s): CHOL, HDL, LDLCALC, TRIG, CHOLHDL, LDLDIRECT in the last 72 hours. Thyroid Function Tests: No results for input(s): TSH, T4TOTAL, FREET4, T3FREE,  THYROIDAB in the last 72 hours. Anemia Panel: No results for input(s): VITAMINB12, FOLATE, FERRITIN, TIBC, IRON, RETICCTPCT in the last 72 hours. Urine analysis:    Component Value Date/Time   COLORURINE AMBER (A) 08/11/2018 2316   APPEARANCEUR HAZY (A) 08/11/2018 2316   LABSPEC 1.018 08/11/2018 2316   PHURINE 5.0 08/11/2018 2316   GLUCOSEU NEGATIVE 08/11/2018 2316   HGBUR NEGATIVE 08/11/2018 2316   BILIRUBINUR NEGATIVE 08/11/2018 2316   KETONESUR 5 (A) 08/11/2018 2316   PROTEINUR 100 (A) 08/11/2018 2316   NITRITE NEGATIVE 08/11/2018 2316   LEUKOCYTESUR NEGATIVE 08/11/2018 2316   Sepsis Labs: @LABRCNTIP (procalcitonin:4,lacticidven:4)  ) Recent Results (from the past 240 hour(s))  MRSA PCR Screening     Status: None   Collection Time: 08/05/18 11:33 AM  Result Value Ref Range Status   MRSA by PCR NEGATIVE NEGATIVE Final    Comment:        The GeneXpert MRSA Assay (FDA approved for NASAL specimens only), is one component of a comprehensive MRSA colonization surveillance program. It is not intended to diagnose MRSA infection nor to guide or monitor treatment for MRSA infections. Performed at [facility], [address] [place], [place] [address]   Culture, blood (Routine X 2) w Reflex to ID Panel     Status: None (Preliminary result)   Collection Time: 08/11/18  7:52 PM  Result Value Ref Range Status   Specimen Description BLOOD RIGHT FOREARM  Final   Special Requests   Final    BOTTLES DRAWN AEROBIC AND ANAEROBIC Blood Culture adequate volume   Culture   Final    NO GROWTH 4 DAYS Performed at [facility], [address] [place], [place] [address]    Report Status PENDING  Incomplete  Culture, blood (Routine X 2) w Reflex to ID Panel     Status: None (Preliminary result)   Collection Time: 08/11/18  7:57 PM  Result Value Ref Range Status   Specimen Description BLOOD LEFT ANTECUBITAL  Final   Special Requests   Final    BOTTLES DRAWN AEROBIC AND  ANAEROBIC Blood Culture adequate volume   Culture   Final    NO GROWTH 4 DAYS Performed at [facility], [address] [place], [place] [address]    Report Status PENDING  Incomplete         Radiology Studies: Mr Brain Wo Contrast  Result Date: 08/14/2018 CLINICAL DATA:  Altered mental status. EXAM: MRI HEAD WITHOUT CONTRAST TECHNIQUE: Multiplanar, multiecho pulse sequences of the brain and surrounding structures were obtained without intravenous contrast. COMPARISON:  Head CT 08/07/2018 FINDINGS: BRAIN: There is no acute infarct, acute hemorrhage, hydrocephalus or extra-axial collection. The midline structures are normal. No midline shift or other mass effect. 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 Diffuse confluent hyperintense T2-weighted signal within the periventricular, deep and juxtacortical white matter, most commonly due to chronic ischemic microangiopathy. Generalized atrophy without lobar predilection. Susceptibility-sensitive sequences show no chronic microhemorrhage or superficial siderosis. VASCULAR: Major intracranial arterial and venous sinus flow voids are normal. SKULL AND UPPER CERVICAL SPINE: Calvarial bone marrow signal is normal. There is no skull base mass. Visualized upper cervical spine and soft tissues are normal. SINUSES/ORBITS: No fluid levels or advanced mucosal thickening. No mastoid or middle ear effusion. The orbits are normal. IMPRESSION: 1. No acute intracranial abnormality. 2. Severe chronic microvascular ischemia. Electronically Signed   By: Deatra Robinson M.D.   On: 08/14/2018 22:48        Scheduled Meds: . amitriptyline  50 mg Oral QHS  . apixaban  2.5 mg Oral BID  . diltiazem  240 mg Oral Daily  . FLUoxetine  20 mg Oral Daily  . mouth rinse  15 mL Mouth Rinse BID  . metoprolol tartrate  25 mg Oral BID  . omega-3 acid ethyl esters  1 g Oral BID AC  . pantoprazole  40 mg Oral Daily  . polyethylene glycol  17 g Oral Daily  . predniSONE   5 mg Oral BID WC  . senna-docusate  1 tablet Oral BID  . umeclidinium-vilanterol  1 puff Inhalation Daily   Continuous Infusions:    LOS: 4 days    Time spent:    Zannie Cove, MD [facility] Page via [email or web address], password [email or web address] After 7PM please contact night-coverage  08/15/2018, 11:11 AM

## 2018-08-15 NOTE — Care Management Important Message (Signed)
Important Message  Patient Details  Name: Isaiah Brady MRN: [id number] Date of Birth: 1933-04-19   Medicare Important Message Given:  Yes    Estevon Fluke 08/15/2018, 2:07 PM

## 2018-08-15 NOTE — Care Management Note (Signed)
Case Management Note  Patient Details  Name: Isaiah Brady MRN: [id number] Date of Birth: 09-09-1933  Subjective/Objective: Pt presented for AKI- PTA from home with family support. Active with [facility] with RN, PT, OT. PT worked with patient and recommendations for SNF. Patient and family is agreeable. Ramond Dial at bedside [phone], Daughter Eunice Blase [phone]. Family has 3 SNF in mind [facility] and [place]. Family wanted additional SNF's as well.                   Action/Plan: CM did reach out to CSW Erie Noe to make her aware of plan of care. CM will continue to monitor for additional needs. Lupita Leash with [facility] is aware that [facility] will follow post Placement.   Expected Discharge Date:                 Expected Discharge Plan:  Skilled Nursing Facility  In-House Referral:  Clinical Social Work  Discharge planning Services  CM Consult  Post Acute Care Choice:  Home Health Choice offered to:  Patient  DME Arranged:  N/A DME Agency:  NA  HH Arranged:  RN, PT, OT(Previously active with [facility].) HH Agency:  [facility]  Status of Service:  Completed, signed off  If discussed at Long Length of Stay Meetings, dates discussed:    Additional Comments:  Gala Lewandowsky, RN 08/15/2018, 3:36 PM
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
## 2018-08-16 LAB — BASIC METABOLIC PANEL
ANION GAP: 7 (ref 5–15)
BUN: 54 mg/dL — ABNORMAL HIGH (ref 8–23)
CALCIUM: 7.8 mg/dL — AB (ref 8.9–10.3)
CO2: 27 mmol/L (ref 22–32)
CREATININE: 1.5 mg/dL — AB (ref 0.61–1.24)
Chloride: 106 mmol/L (ref 98–111)
GFR, EST AFRICAN AMERICAN: 48 mL/min — AB (ref >60)
GFR, EST NON AFRICAN AMERICAN: 41 mL/min — AB (ref >60)
Glucose, Bld: 113 mg/dL — ABNORMAL HIGH (ref 70–99)
Potassium: 4.9 mmol/L (ref 3.5–5.1)
SODIUM: 140 mmol/L (ref 135–145)

## 2018-08-16 LAB — CBC
HEMATOCRIT: 35.6 % — AB (ref 39.0–52.0)
Hemoglobin: 11 g/dL — ABNORMAL LOW (ref 13.0–17.0)
MCH: 29.6 pg (ref 26.0–34.0)
MCHC: 30.9 g/dL (ref 30.0–36.0)
MCV: 96 fL (ref 80.0–100.0)
NRBC: 0 % (ref 0.0–0.2)
Platelets: 205 10*3/uL (ref 150–400)
RBC: 3.71 MIL/uL — ABNORMAL LOW (ref 4.22–5.81)
RDW: 15.5 % (ref 11.5–15.5)
WBC: 14.1 10*3/uL — AB (ref 4.0–10.5)

## 2018-08-16 LAB — CULTURE, BLOOD (ROUTINE X 2)
CULTURE: NO GROWTH
Culture: NO GROWTH
SPECIAL REQUESTS: ADEQUATE
SPECIAL REQUESTS: ADEQUATE

## 2018-08-16 MED ORDER — SENNOSIDES-DOCUSATE SODIUM 8.6-50 MG PO TABS
2.0000 | ORAL_TABLET | Freq: Two times a day (BID) | ORAL | Status: DC
  Administered 2018-08-16 – 2018-08-17 (×2): 2 via ORAL
  Filled 2018-08-16 (×3): qty 2

## 2018-08-16 NOTE — [place] FL2 (Signed)
[place] MEDICAID FL2 LEVEL OF CARE SCREENING TOOL     IDENTIFICATION  Patient Name: Isaiah Brady Birthdate: 1933-07-05 Sex: male Admission Date (Current Location): 08/11/2018  [facility] and [place] Number:  [employer] and Address:  [facility] [place], [address], [place], [place] [address]      Provider Number: [id number]  Attending Physician Name and Address:  Jerald Kief, MD  Relative Name and Phone Number:  Manvir Prabhu - son, [phone]    Current Level of Care: Hospital Recommended Level of Care: Skilled Nursing Facility Prior Approval Number:    Date Approved/Denied:   PASRR Number: [id number](Eff. 08/16/18)  Discharge Plan: SNF    Current Diagnoses: Patient Active Problem List   Diagnosis Date Noted  . Palliative care by specialist   . Goals of care, counseling/discussion   . COPD (chronic obstructive pulmonary disease) ([facility]) 08/11/2018  . Depression with anxiety 08/11/2018  . Leukocytosis 08/11/2018  . Chronic respiratory failure with hypoxia ([facility]) 08/11/2018  . Psoriasis 08/11/2018  . Metabolic acidosis 08/08/2018  . Acute metabolic encephalopathy 08/08/2018  . Paroxysmal atrial fibrillation ([facility]) 08/07/2018  . AKI (acute kidney injury) ([facility]) 08/05/2018  . Hyperkalemia 08/05/2018  . Encephalopathy 08/05/2018  . Pulmonary edema   . Essential hypertension, benign 03/12/2013  . GERD (gastroesophageal reflux disease) 03/12/2013  . Cough 03/12/2013    Orientation RESPIRATION BLADDER Height & Weight     Self, Situation, Place  O2(2L O2) Continent, External catheter Weight: 207 lb 10.8 oz (94.2 kg) Height:  5\' 10"  (177.8 cm)  BEHAVIORAL SYMPTOMS/MOOD NEUROLOGICAL BOWEL NUTRITION STATUS      Continent Diet(Heart healthy)  AMBULATORY STATUS COMMUNICATION OF NEEDS Skin   Limited Assist(Min Guard per PT) Verbally Other (Comment), Skin abrasions(Abrasion left knee, Ecchymosis right arm, Fissure mid sacrum treated with  barrier cream)                       Personal Care Assistance Level of Assistance  Bathing, Feeding, Dressing Bathing Assistance: Limited assistance Feeding assistance: Independent Dressing Assistance: Limited assistance     Functional Limitations Info  Sight, Hearing, Speech Sight Info: Adequate Hearing Info: Adequate Speech Info: Adequate    SPECIAL CARE FACTORS FREQUENCY  PT (By licensed PT), OT (By licensed OT)     PT Frequency: Evaluateion 10/7 and a minimum of 3X per week therapy recommended during acute inpatient stayr we OT Frequency: (OT eval pending)            Contractures Contractures Info: Not present    Additional Factors Info  Code Status, Allergies Code Status Info: DNR Allergies Info: No known allergies           Current Medications (08/16/2018):  This is the current hospital active medication list Current Facility-Administered Medications  Medication Dose Route Frequency Provider Last Rate Last Dose  . acetaminophen (TYLENOL) tablet 650 mg  650 mg Oral Q6H PRN Lorretta Harp, MD   650 mg at 08/13/18 2142   Or  . acetaminophen (TYLENOL) suppository 650 mg  650 mg Rectal Q6H PRN Lorretta Harp, MD      . albuterol (PROVENTIL) (2.5 MG/3ML) 0.083% nebulizer solution 2.5 mg  2.5 mg Nebulization Q4H PRN Lorretta Harp, MD      . amitriptyline (ELAVIL) tablet 50 mg  50 mg Oral QHS Lorretta Harp, MD   50 mg at 08/15/18 2136  . apixaban (ELIQUIS) tablet 2.5 mg  2.5 mg Oral BID Jeanella Craze, Dwayne A,  RPH   2.5 mg at 08/15/18 2136  . dextromethorphan-guaiFENesin (MUCINEX DM) 30-600 MG per 12 hr tablet 1 tablet  1 tablet Oral BID PRN Lorretta Harp, MD      . diltiazem (CARDIZEM CD) 24 hr capsule 240 mg  240 mg Oral Daily Delano Metz, MD   240 mg at 08/15/18 0923  . FLUoxetine (PROZAC) capsule 20 mg  20 mg Oral Daily Lorretta Harp, MD   20 mg at 08/15/18 4098  . MEDLINE mouth rinse  15 mL Mouth Rinse BID Lorretta Harp, MD   15 mL at 08/15/18 2137  . metoprolol tartrate (LOPRESSOR)  tablet 25 mg  25 mg Oral BID Zannie Cove, MD   25 mg at 08/15/18 2136  . omega-3 acid ethyl esters (LOVAZA) capsule 1 g  1 g Oral BID AC Lorretta Harp, MD   1 g at 08/15/18 1703  . ondansetron (ZOFRAN) tablet 4 mg  4 mg Oral Q6H PRN Lorretta Harp, MD       Or  . ondansetron [facility]) injection 4 mg  4 mg Intravenous Q6H PRN Lorretta Harp, MD      . pantoprazole (PROTONIX) EC tablet 40 mg  40 mg Oral Daily Lorretta Harp, MD   40 mg at 08/15/18 1191  . polyethylene glycol (MIRALAX / GLYCOLAX) packet 17 g  17 g Oral Daily Zannie Cove, MD   17 g at 08/15/18 0923  . predniSONE (DELTASONE) tablet 5 mg  5 mg Oral BID WC Zannie Cove, MD   5 mg at 08/15/18 1703  . senna-docusate (Senokot-S) tablet 2 tablet  2 tablet Oral BID Joylene Draft, NP      . umeclidinium-vilanterol (ANORO ELLIPTA) 62.5-25 MCG/INH 1 puff  1 puff Inhalation Daily Lorretta Harp, MD   1 puff at 08/16/18 4782     Discharge Medications: Please see discharge summary for a list of discharge medications.  Relevant Imaging Results:  Relevant Lab Results:   Additional Information ss#[id number].  Cristobal Goldmann, LCSW

## 2018-08-16 NOTE — Progress Notes (Signed)
PROGRESS NOTE    Fabrizio Filip [facility]  ZOX:[id number] DOB: 08-Sep-1933 DOA: 08/11/2018 PCP: Richardean Chimera, MD    Brief Narrative:  84 y.o.malewith medical history significant ofhypertension, chronic respiratory failure due to COPD on 2 L nasal cannula oxygen, GERD, depression with anxiety, peripheral neuropathy, psoriasis, atrial fibrillation on Eliquis, pulmonary hypertension, who presented with AKI/decreased urine output. Patient was recently hospitalized from 9/28-10/3 due to altered mental status, possible pneumonia and AKI. Patient had creatinine 5.2, treated with CRRT, creatinine improved to 1.75 at discharge. Hecompleted 5-day antibiotic treatment for PNA.Marland Kitchen Pt wasjust discharged 10/3 and readmitted 10/4 with AKI and decreased UO -hydrated, creatinine improving, nephrology following  Assessment & Plan:   Principal Problem:   AKI (acute kidney injury) (HCC) Active Problems:   Essential hypertension, benign   GERD (gastroesophageal reflux disease)   Paroxysmal atrial fibrillation (HCC)   COPD (chronic obstructive pulmonary disease) (HCC)   Depression with anxiety   Leukocytosis   Chronic respiratory failure with hypoxia (HCC)   Psoriasis   Palliative care by specialist   Goals of care, counseling/discussion  AKI (acute kidney injury)  -Unclear etiology. Suspicion for medication effect from ARB discontinued prior to admit -Nephrology following -Last echo with preserved EF and moderate pulmonary hypertension PA pressure 60 -Urinalysis with hyaline casts, then improving with gentle hydration down from 4.8 to to 1 7 today, discontinue IV fluids -Renal US with medical renal dz, Renal artery duplex with no obvious renal artery stenosis -Renal dosing of medications, cut down gabapentin and lorazepam dose -Recent admission last week with acute kidney injury likely hemodynamically mediated and worsened by ARB, required CRRT x2 9/28 and 9/29 -Cr is improving, repeat bmet in  AM  Leukocytosis -No clear source of infection at this time -Remains afebrile, exam without localizing infection -Leukocytosis had improved  R arm weakness -noted to be ongoing for 2-18months per pt -MRI brain noted severe chronic microvascular ischemia, no acute findings, has Afib -PT following -Plan for SNF at time of discharge  Atrial Fibrillation: CHA2DS2-VASc Scoreis 5 -He was started on Eliquis earlier, but  with worsening acute kidney injury this is switched over to IV heparin -Given improvement in kidney function have resumed Eliquis -Continue metoprololand cardizem as tolerated -Recent CT with evidence of prior strokes, on exam has right arm weakness noted, onset unclear per son, patient thinks it started 2-73months ago after he fell and injured his right shoulder, MRI brain noted to have no acute findings  COPD (chronic obstructive pulmonary disease)andchronic respiratory failurewith hypoxia: -Baseline O2 requirements of 2 L nasal cannula oxygen at home. -stable, continue bronchodilators as needed  Depression and anxiety: -Continue home medications: -Stable at present  Psoriasis: -Continue home prednisone -Transition back to basal dose -Appears to be stable at present  HTN:  -Continue home medications:Metoprolol, Cardizem -BP stable, albeit suboptimally controlled  GERD: -Protonix -Stable at this time  DVT prophylaxis: eliquis Code Status: DNR Family Communication: Pt in room, family at bedside Disposition Plan: SNF, timing uncertain  Consultants:   Nephrology  Procedures:     Antimicrobials: Anti-infectives (From admission, onward)   None       Subjective: Without complaints  Objective: Vitals:   08/15/18 2027 08/16/18 0513 08/16/18 0815 08/16/18 0917  BP: (!) 151/61 (!) 156/66 (!) 161/62   Pulse: 64 62 60   Resp: 18 18 18    Temp: 98.1 F (36.7 C) 97.9 F (36.6 C) 97.7 F (36.5 C)   TempSrc: Oral Oral Oral   SpO2:  98%  97% 99% 98%  Weight: 94.2 kg     Height:        Intake/Output Summary (Last 24 hours) at 08/16/2018 1647 Last data filed at 08/16/2018 1300 Gross per 24 hour  Intake 780 ml  Output 1600 ml  Net -820 ml   Filed Weights   08/12/18 2100 08/13/18 0500 08/15/18 2027  Weight: 94.4 kg 94.4 kg 94.2 kg    Examination:  General exam: Appears calm and comfortable  Respiratory system: Clear to auscultation. Respiratory effort normal. Cardiovascular system: S1 & S2 heard, RRR Gastrointestinal system: Abdomen is nondistended, soft and nontender. No organomegaly or masses felt. Normal bowel sounds heard. Central nervous system: Alert and oriented. No focal neurological deficits. Extremities: Symmetric 5 x 5 power. Skin: No rashes, lesions Psychiatry: Judgement and insight appear normal. Mood & affect appropriate.   Data Reviewed: I have personally reviewed following labs and imaging studies  CBC: Recent Labs  Lab 08/12/18 0614 08/13/18 0440 08/14/18 0433 08/15/18 0534 08/16/18 0525  WBC 17.9* 14.3* 12.6* 14.1* 14.1*  NEUTROABS 9.8*  --   --   --   --   HGB 13.2 11.8* 10.7* 11.2* 11.0*  HCT 42.5 38.2* 34.6* 36.8* 35.6*  MCV 95.9 97.0 97.7 97.4 96.0  PLT 202 201 191 204 205   Basic Metabolic Panel: Recent Labs  Lab 08/12/18 0614 08/13/18 0440 08/14/18 0433 08/15/18 0534 08/16/18 0525  NA 138 135 140 144 140  K 3.5 4.1 4.5 4.8 4.9  CL 91* 97* 107 110 106  CO2 29 25 24 28 27   GLUCOSE 87 139* 176* 138* 113*  BUN 92* 92* 84* 66* 54*  CREATININE 4.79* 3.66* 2.61* 1.75* 1.50*  CALCIUM 7.5* 6.9* 6.8* 7.3* 7.8*   GFR: Estimated Creatinine Clearance: 43 mL/min (A) (by C-G formula based on SCr of 1.5 mg/dL (H)). Liver Function Tests: Recent Labs  Lab 08/11/18 1619 08/11/18 1808  AST 86* 29  ALT 12 24  ALKPHOS 62 62  BILITOT 2.5* 0.8  PROT 6.2* 6.6  ALBUMIN 3.3* 3.4*   No results for input(s): LIPASE, AMYLASE in the last 168 hours. No results for input(s): AMMONIA  in the last 168 hours. Coagulation Profile: No results for input(s): INR, PROTIME in the last 168 hours. Cardiac Enzymes: No results for input(s): CKTOTAL, CKMB, CKMBINDEX, TROPONINI in the last 168 hours. BNP (last 3 results) No results for input(s): PROBNP in the last 8760 hours. HbA1C: No results for input(s): HGBA1C in the last 72 hours. CBG: No results for input(s): GLUCAP in the last 168 hours. Lipid Profile: No results for input(s): CHOL, HDL, LDLCALC, TRIG, CHOLHDL, LDLDIRECT in the last 72 hours. Thyroid Function Tests: No results for input(s): TSH, T4TOTAL, FREET4, T3FREE, THYROIDAB in the last 72 hours. Anemia Panel: No results for input(s): VITAMINB12, FOLATE, FERRITIN, TIBC, IRON, RETICCTPCT in the last 72 hours. Sepsis Labs: Recent Labs  Lab 08/11/18 1815 08/11/18 2017 08/12/18 0614 08/13/18 0440 08/14/18 0433  PROCALCITON  --   --  0.21 0.11 <0.10  LATICACIDVEN 1.66 1.55  --   --   --     Recent Results (from the past 240 hour(s))  Culture, blood (Routine X 2) w Reflex to ID Panel     Status: None   Collection Time: 08/11/18  7:52 PM  Result Value Ref Range Status   Specimen Description BLOOD RIGHT FOREARM  Final   Special Requests   Final    BOTTLES DRAWN AEROBIC AND ANAEROBIC Blood Culture adequate volume  Culture   Final    NO GROWTH 5 DAYS Performed at [facility], [address] [place], [place] [address]    Report Status 08/16/2018 FINAL  Final  Culture, blood (Routine X 2) w Reflex to ID Panel     Status: None   Collection Time: 08/11/18  7:57 PM  Result Value Ref Range Status   Specimen Description BLOOD LEFT ANTECUBITAL  Final   Special Requests   Final    BOTTLES DRAWN AEROBIC AND ANAEROBIC Blood Culture adequate volume   Culture   Final    NO GROWTH 5 DAYS Performed at [facility], [address] [place], [place] [address]    Report Status 08/16/2018 FINAL  Final     Radiology Studies: Mr Brain Wo Contrast  Result  Date: 08/14/2018 CLINICAL DATA:  Altered mental status. EXAM: MRI HEAD WITHOUT CONTRAST TECHNIQUE: Multiplanar, multiecho pulse sequences of the brain and surrounding structures were obtained without intravenous contrast. COMPARISON:  Head CT 08/07/2018 FINDINGS: BRAIN: There is no acute infarct, acute hemorrhage, hydrocephalus or extra-axial collection. The midline structures are normal. No midline shift or other mass effect. 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 Diffuse confluent hyperintense T2-weighted signal within the periventricular, deep and juxtacortical white matter, most commonly due to chronic ischemic microangiopathy. Generalized atrophy without lobar predilection. Susceptibility-sensitive sequences show no chronic microhemorrhage or superficial siderosis. VASCULAR: Major intracranial arterial and venous sinus flow voids are normal. SKULL AND UPPER CERVICAL SPINE: Calvarial bone marrow signal is normal. There is no skull base mass. Visualized upper cervical spine and soft tissues are normal. SINUSES/ORBITS: No fluid levels or advanced mucosal thickening. No mastoid or middle ear effusion. The orbits are normal. IMPRESSION: 1. No acute intracranial abnormality. 2. Severe chronic microvascular ischemia. Electronically Signed   By: Deatra Robinson M.D.   On: 08/14/2018 22:48    Scheduled Meds: . amitriptyline  50 mg Oral QHS  . apixaban  2.5 mg Oral BID  . diltiazem  240 mg Oral Daily  . FLUoxetine  20 mg Oral Daily  . mouth rinse  15 mL Mouth Rinse BID  . metoprolol tartrate  25 mg Oral BID  . omega-3 acid ethyl esters  1 g Oral BID AC  . pantoprazole  40 mg Oral Daily  . polyethylene glycol  17 g Oral Daily  . predniSONE  5 mg Oral BID WC  . senna-docusate  2 tablet Oral BID  . umeclidinium-vilanterol  1 puff Inhalation Daily   Continuous Infusions:   LOS: 5 days   Rickey Barbara, MD [facility] Pager On Amion  If 7PM-7AM, please contact night-coverage 08/16/2018, 4:47  PM
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
## 2018-08-16 NOTE — Clinical Social Work Note (Signed)
Clinical Social Work Assessment  Patient Details  Name: Isaiah Brady MRN: [id number] Date of Birth: Nov 16, 1932  Date of referral:  08/15/18               Reason for consult:  Facility Placement, Discharge Planning                Permission sought to share information with:  Family Supports Permission granted to share information::  Yes, Verbal Permission Granted  Name::     Isaiah Brady  Agency::     Relationship::  Son  Contact Information:  [phone]  Housing/Transportation Living arrangements for the past 2 months:  Single Family Home Source of Information:  Patient, Adult Children Patient Interpreter Needed:  None Criminal Activity/Legal Involvement Pertinent to Current Situation/Hospitalization:  No - Comment as needed Significant Relationships:  Other Family Members, Adult Children Lives with:  Self Do you feel safe going back to the place where you live?  Yes(Patient expressed feeling that he could d/c home, but is agreeable to ST rehab) Need for family participation in patient care:  Yes (Comment)  Care giving concerns:  Son, Isaiah Brady concerned about patient discharging home as he lives alone and son feels that his dad will benefit from ST rehab.  Social Worker assessment / plan: On 08/15/18 CSW talked with patient and son Isaiah Brady at the bedside regarding d/c disposition and recommendation of ST rehab. During the conversation, Jeffrey's wife Isaiah Brady came into room. When asked, CSW advised that patient has never been to a skilled facility for ST rehab. Son expressed his concerns regarding patient's safety at home and living alone, and patient was asked about his thoughts regarding going to ST rehab or going home. Mr. Maske expressed that he 'felt like' he would be safe at home alone walking or transferring and this was discussed. Patient expressed agreement with ST rehab based on son's concerns and CSW advised that patient has never been to ST rehab and the process explained.  CSW provided with facility preferences in [place].   Employment status:  Retired Health and safety inspector:  Managed Medicare(BCBS) PT Recommendations:  Skilled Nursing Facility Information / Referral to community resources:  Other (Comment Required)(CSW provided with facility preferences)  Patient/Family's Response to care:  No concerns expressed by patient or family regarding his care during hospitalization.  Patient/Family's Understanding of and Emotional Response to Diagnosis, Current Treatment, and Prognosis:  Son expressed concern for his dad's well-being at home post-discharge and his agreement with ST rehab. After further discussion, patient was also in agreement.  Emotional Assessment Appearance:  Appears stated age Attitude/Demeanor/Rapport:  Engaged Affect (typically observed):  Appropriate, Pleasant Orientation:  Oriented to Self, Oriented to Place, Oriented to Situation Alcohol / Substance use:  Tobacco Use, Alcohol Use, Illicit Drugs(Per H&P patient quit smoking and does not drink or use illicit) Psych involvement (Current and /or in the community):  No (Comment)  Discharge Needs  Concerns to be addressed:  Discharge Planning Concerns Readmission within the last 30 days:  Yes Current discharge risk:  None Barriers to Discharge:  Insurance Authorization   Cristobal Goldmann, LCSW 08/16/2018, 10:53 AM

## 2018-08-16 NOTE — Clinical Social Work Note (Addendum)
[facility] contacted and talked with admissions director Lorelle Formosa. regarding patient. Per Elease Hashimoto, they can take patient and will initiate authorization with [employer]. CSW talked later with son Tinnie Gens and informed him of SNF's acceptance and d/c pending insurance authorization, CSW will continue to follow and facilitate discharge to [facility] once medically stable.  4:33 pm - Received call from admissions director Elease Hashimoto regarding patient. Their nurse initiated insurance authorization this morning with clinicals and PT note. Elease Hashimoto advised that as soon as OT has seen patient, the note will be sent to the facility.Genelle Bal, MSW, LCSW Licensed Clinical Social Worker Clinical Social Work Department [employer] [phone]

## 2018-08-17 DIAGNOSIS — J449 Chronic obstructive pulmonary disease, unspecified: Secondary | ICD-10-CM | POA: Diagnosis not present

## 2018-08-17 DIAGNOSIS — E875 Hyperkalemia: Secondary | ICD-10-CM | POA: Diagnosis not present

## 2018-08-17 DIAGNOSIS — I480 Paroxysmal atrial fibrillation: Secondary | ICD-10-CM | POA: Diagnosis not present

## 2018-08-17 DIAGNOSIS — J9611 Chronic respiratory failure with hypoxia: Secondary | ICD-10-CM | POA: Diagnosis not present

## 2018-08-17 DIAGNOSIS — F339 Major depressive disorder, recurrent, unspecified: Secondary | ICD-10-CM | POA: Diagnosis not present

## 2018-08-17 DIAGNOSIS — Z7901 Long term (current) use of anticoagulants: Secondary | ICD-10-CM | POA: Diagnosis not present

## 2018-08-17 DIAGNOSIS — R2681 Unsteadiness on feet: Secondary | ICD-10-CM | POA: Diagnosis not present

## 2018-08-17 DIAGNOSIS — M1A9XX0 Chronic gout, unspecified, without tophus (tophi): Secondary | ICD-10-CM | POA: Diagnosis not present

## 2018-08-17 DIAGNOSIS — Z79899 Other long term (current) drug therapy: Secondary | ICD-10-CM | POA: Diagnosis not present

## 2018-08-17 DIAGNOSIS — I4891 Unspecified atrial fibrillation: Secondary | ICD-10-CM | POA: Diagnosis not present

## 2018-08-17 DIAGNOSIS — N170 Acute kidney failure with tubular necrosis: Secondary | ICD-10-CM | POA: Diagnosis not present

## 2018-08-17 DIAGNOSIS — Z9981 Dependence on supplemental oxygen: Secondary | ICD-10-CM | POA: Diagnosis not present

## 2018-08-17 DIAGNOSIS — M6281 Muscle weakness (generalized): Secondary | ICD-10-CM | POA: Diagnosis not present

## 2018-08-17 DIAGNOSIS — F418 Other specified anxiety disorders: Secondary | ICD-10-CM | POA: Diagnosis not present

## 2018-08-17 DIAGNOSIS — N179 Acute kidney failure, unspecified: Secondary | ICD-10-CM | POA: Diagnosis not present

## 2018-08-17 DIAGNOSIS — I10 Essential (primary) hypertension: Secondary | ICD-10-CM | POA: Diagnosis not present

## 2018-08-17 DIAGNOSIS — Z8673 Personal history of transient ischemic attack (TIA), and cerebral infarction without residual deficits: Secondary | ICD-10-CM | POA: Diagnosis not present

## 2018-08-17 LAB — BASIC METABOLIC PANEL
Anion gap: 9 (ref 5–15)
BUN: 40 mg/dL — AB (ref 8–23)
CHLORIDE: 103 mmol/L (ref 98–111)
CO2: 28 mmol/L (ref 22–32)
CREATININE: 1.18 mg/dL (ref 0.61–1.24)
Calcium: 8.1 mg/dL — ABNORMAL LOW (ref 8.9–10.3)
GFR calc Af Amer: >60 mL/min (ref >60)
GFR calc non Af Amer: 55 mL/min — ABNORMAL LOW (ref >60)
Glucose, Bld: 103 mg/dL — ABNORMAL HIGH (ref 70–99)
Potassium: 5 mmol/L (ref 3.5–5.1)
Sodium: 140 mmol/L (ref 135–145)

## 2018-08-17 LAB — CBC
HEMATOCRIT: 37.9 % — AB (ref 39.0–52.0)
HEMOGLOBIN: 11.3 g/dL — AB (ref 13.0–17.0)
MCH: 28.6 pg (ref 26.0–34.0)
MCHC: 29.8 g/dL — ABNORMAL LOW (ref 30.0–36.0)
MCV: 95.9 fL (ref 80.0–100.0)
NRBC: 0 % (ref 0.0–0.2)
Platelets: 244 10*3/uL (ref 150–400)
RBC: 3.95 MIL/uL — AB (ref 4.22–5.81)
RDW: 15 % (ref 11.5–15.5)
WBC: 15 10*3/uL — AB (ref 4.0–10.5)

## 2018-08-17 MED ORDER — SENNOSIDES-DOCUSATE SODIUM 8.6-50 MG PO TABS: 2.0000 | ORAL_TABLET | Freq: Two times a day (BID) | ORAL | Status: AC

## 2018-08-17 MED ORDER — METOPROLOL TARTRATE 25 MG PO TABS: 25.0000 mg | ORAL_TABLET | Freq: Two times a day (BID) | ORAL | 0 refills | Status: AC

## 2018-08-17 MED ORDER — APIXABAN 5 MG PO TABS: 5.0000 mg | ORAL_TABLET | Freq: Two times a day (BID) | ORAL | 0 refills | Status: AC

## 2018-08-17 MED ORDER — APIXABAN 5 MG PO TABS
5.0000 mg | ORAL_TABLET | Freq: Two times a day (BID) | ORAL | Status: DC
  Administered 2018-08-17: 5 mg via ORAL
  Filled 2018-08-17: qty 1

## 2018-08-17 MED ORDER — POLYETHYLENE GLYCOL 3350 17 G PO PACK: 17.0000 g | PACK | Freq: Every day | ORAL | 0 refills | Status: AC

## 2018-08-17 NOTE — Progress Notes (Signed)
Isaiah Brady to be D/C'd Rehab per MD order.  Discussed prescriptions and follow up appointments with the patient. Prescriptions given to patient, medication list explained in detail. Pt verbalized understanding.  Allergies as of 08/17/2018   No Known Allergies     Medication List    STOP taking these medications   ALPRAZolam 1 MG tablet Commonly known as:  XANAX   gabapentin 300 MG capsule Commonly known as:  NEURONTIN   traMADol 50 MG tablet Commonly known as:  ULTRAM     TAKE these medications   acetaminophen 325 MG tablet Commonly known as:  TYLENOL Take 325-650 mg by mouth every 6 (six) hours as needed (for pain or headaches).   alendronate 70 MG tablet Commonly known as:  FOSAMAX Take 70 mg by mouth every Sunday. Take with a full glass of water on an empty stomach.   amitriptyline 50 MG tablet Commonly known as:  ELAVIL Take 50 mg by mouth at bedtime.   ANORO ELLIPTA 62.5-25 MCG/INH Aepb Generic drug:  umeclidinium-vilanterol Inhale 1 puff into the lungs daily.   apixaban 5 MG Tabs tablet Commonly known as:  ELIQUIS Take 1 tablet (5 mg total) by mouth 2 (two) times daily. What changed:    medication strength  how much to take   diltiazem 240 MG 24 hr capsule Commonly known as:  CARDIZEM CD Take 240 mg by mouth daily. What changed:  Another medication with the same name was removed. Continue taking this medication, and follow the directions you see here.   fish oil-omega-3 fatty acids 1000 MG capsule Take 1 g by mouth 2 (two) times daily before a meal.   FLUoxetine 20 MG capsule Commonly known as:  PROZAC Take 20 mg by mouth daily.   metoprolol tartrate 25 MG tablet Commonly known as:  LOPRESSOR Take 1 tablet (25 mg total) by mouth 2 (two) times daily. What changed:    medication strength  how much to take   omeprazole 20 MG capsule Commonly known as:  PRILOSEC Take 20 mg by mouth 2 (two) times daily before a meal.   polyethylene glycol  packet Commonly known as:  MIRALAX / GLYCOLAX Take 17 g by mouth daily. Start taking on:  08/18/2018   predniSONE 5 MG tablet Commonly known as:  DELTASONE Take 5 mg by mouth 2 (two) times daily with a meal.   senna-docusate 8.6-50 MG tablet Commonly known as:  Senokot-S Take 2 tablets by mouth 2 (two) times daily.       Vitals:   08/17/18 0500 08/17/18 1018  BP: (!) 162/58 (!) 180/74  Pulse: 60 69  Resp: 18 18  Temp: 98.2 F (36.8 C) 98.4 F (36.9 C)  SpO2: 100% 98%    Skin clean, dry and intact without evidence of skin break down, no evidence of skin tears noted. IV catheter discontinued intact. Site without signs and symptoms of complications. Dressing and pressure applied. Pt denies pain at this time. No complaints noted.  An After Visit Summary was printed and given to the patient. Patient escorted via WC, and D/C home via private auto. Isaiah Brady to be D/C'd Rehab per MD order.  Discussed prescriptions and follow up appointments with the patient. Prescriptions given to patient, medication list explained in detail. Pt verbalized understanding.  Allergies as of 08/17/2018   No Known Allergies     Medication List    STOP taking these medications   ALPRAZolam 1 MG tablet Commonly known as:  XANAX   gabapentin 300 MG capsule Commonly known as:  NEURONTIN   traMADol 50 MG tablet Commonly known as:  ULTRAM     TAKE these medications   acetaminophen 325 MG tablet Commonly known as:  TYLENOL Take 325-650 mg by mouth every 6 (six) hours as needed (for pain or headaches).   alendronate 70 MG tablet Commonly known as:  FOSAMAX Take 70 mg by mouth every Sunday. Take with a full glass of water on an empty stomach.   amitriptyline 50 MG tablet Commonly known as:  ELAVIL Take 50 mg by mouth at bedtime.   ANORO ELLIPTA 62.5-25 MCG/INH Aepb Generic drug:  umeclidinium-vilanterol Inhale 1 puff into the lungs daily.   apixaban 5 MG Tabs tablet Commonly known  as:  ELIQUIS Take 1 tablet (5 mg total) by mouth 2 (two) times daily. What changed:    medication strength  how much to take   diltiazem 240 MG 24 hr capsule Commonly known as:  CARDIZEM CD Take 240 mg by mouth daily. What changed:  Another medication with the same name was removed. Continue taking this medication, and follow the directions you see here.   fish oil-omega-3 fatty acids 1000 MG capsule Take 1 g by mouth 2 (two) times daily before a meal.   FLUoxetine 20 MG capsule Commonly known as:  PROZAC Take 20 mg by mouth daily.   metoprolol tartrate 25 MG tablet Commonly known as:  LOPRESSOR Take 1 tablet (25 mg total) by mouth 2 (two) times daily. What changed:    medication strength  how much to take   omeprazole 20 MG capsule Commonly known as:  PRILOSEC Take 20 mg by mouth 2 (two) times daily before a meal.   polyethylene glycol packet Commonly known as:  MIRALAX / GLYCOLAX Take 17 g by mouth daily. Start taking on:  08/18/2018   predniSONE 5 MG tablet Commonly known as:  DELTASONE Take 5 mg by mouth 2 (two) times daily with a meal.   senna-docusate 8.6-50 MG tablet Commonly known as:  Senokot-S Take 2 tablets by mouth 2 (two) times daily.       Vitals:   08/17/18 0500 08/17/18 1018  BP: (!) 162/58 (!) 180/74  Pulse: 60 69  Resp: 18 18  Temp: 98.2 F (36.8 C) 98.4 F (36.9 C)  SpO2: 100% 98%    Skin clean, dry and intact without evidence of skin break down, no evidence of skin tears noted. IV catheter discontinued intact. Site without signs and symptoms of complications. Dressing and pressure applied. Pt denies pain at this time. No complaints noted.  An After Visit Summary was printed and given to the patient. Patient escorted via WC, and D/C home via private auto.

## 2018-08-17 NOTE — Clinical Social Work Placement (Signed)
   CLINICAL SOCIAL WORK PLACEMENT  NOTE 08/17/18 - DISCHARGED TO [facility] snf VIA AMBULANCE  Date:  08/17/2018  Patient Details  Name: Isaiah Brady MRN: [id number] Date of Birth: 09/23/1933  Clinical Social Work is seeking post-discharge placement for this patient at the Skilled  Nursing Facility level of care (*CSW will initial, date and re-position this form in  chart as items are completed):  No(CSW provided with facility preferences)   Patient/family provided with [facility] Clinical Social Work Department's list of facilities offering this level of care within the geographic area requested by the patient (or if unable, by the patient's family).  Yes   Patient/family informed of their freedom to choose among providers that offer the needed level of care, that participate in Medicare, Medicaid or managed care program needed by the patient, have an available bed and are willing to accept the patient.  No   Patient/family informed of [facility]'s ownership interest in [facility] and [facility], as well as of the fact that they are under no obligation to receive care at these facilities.  PASRR submitted to EDS on 08/16/18     PASRR number received on 08/16/18     Existing PASRR number confirmed on       FL2 transmitted to all facilities in geographic area requested by pt/family on 08/16/18     FL2 transmitted to all facilities within larger geographic area on       Patient informed that his/her managed care company has contracts with or will negotiate with certain facilities, including the following:        Yes   Patient/family informed of bed offers received.  Patient chooses bed at  [facility]     Physician recommends and patient chooses bed at      Patient to be transferred to  [facility] on 08/17/18.  Patient to be transferred to facility by Ambulance     Patient family notified on 08/17/18 of transfer.  Name of family member notified:   Daughter and another male family member at the bedside     PHYSICIAN       Additional Comment:    _______________________________________________ Cristobal Goldmann, LCSW 08/17/2018, 3:26 PM

## 2018-08-17 NOTE — Progress Notes (Signed)
Physical Therapy Treatment Patient Details Name: Isaiah Brady MRN: [id number] DOB: 1932-11-10 Today's Date: 08/17/2018    History of Present Illness Pt is an 84 y.o. male admitted 08/05/18 with increasing falls; found to have AKI on CKD III. Also with acute respiratory, treated for CPAP; encephalopathy likely metabolic from uremia; new onset a-fib. CT showed now acute intracranial abnormality; chronic ischemic changes with R basal ganglia lacunar stroke. PMH includes COPD, HTN, peripheral neuropathy.    PT Comments    Pt sitting in recliner with daughter present on entry. Pt willing to go for short walk with therapy. Pt making good progress towards his goals however continues to be limited in safe mobility by generalized weakness and decreased endurance. Pt requires min guard for transfers and hands on min guard for ambulation with RW. Pt experiences 4/4 DoE with ambulation requiring standing rest break, however maintains O2 saturation >93%O2 on 2L O2 via [place]. D/c plans remain appropriate at this time.      Follow Up Recommendations  SNF;Supervision/Assistance - 24 hour;Other (comment)(Family unable to provide 24 hour assist)     Equipment Recommendations  None recommended by PT    Recommendations for Other Services       Precautions / Restrictions Precautions Precautions: Fall Restrictions Weight Bearing Restrictions: No    Mobility  Bed Mobility               General bed mobility comments: up in recliner  Transfers Overall transfer level: Needs assistance Equipment used: Rolling walker (2 wheeled) Transfers: Sit to/from Stand Sit to Stand: Min guard         General transfer comment: min guard for safety, good power up to standing before reaching for RW  Ambulation/Gait Ambulation/Gait assistance: Min guard Gait Distance (Feet): (hallway ambulation) Assistive device: Rolling walker (2 wheeled) Gait Pattern/deviations: Step-through pattern;Decreased stride  length;Trunk flexed Gait velocity: Decreased Gait velocity interpretation: <1.8 ft/sec, indicate of risk for recurrent falls General Gait Details: Slow pace, good use of RW for balance and steadiness, standing rest break for 4/4 DoE         Balance Overall balance assessment: Needs assistance   Sitting balance-[name] Scale: Good       Standing balance-[name] Scale: Fair Standing balance comment: Able to static stand at sink and perform ADL tasks without UE support                            Cognition Arousal/Alertness: Awake/alert Behavior During Therapy: WFL for tasks assessed/performed Overall Cognitive Status: Within Functional Limits for tasks assessed                                           General Comments General comments (skin integrity, edema, etc.): daughter present during session. on 2L supplemental O2 via [place], and maintained SaO2 >93%O2 throughout ambulation       Pertinent Vitals/Pain Pain Assessment: No/denies pain    Home Living Family/patient expects to be discharged to:: Private residence Living Arrangements: Alone Available Help at Discharge: Family;Other (Comment) Type of Home: House Home Access: Stairs to enter Entrance Stairs-Rails: Right Home Layout: One level Home Equipment: Walker - 2 wheels;Cane - single point      Prior Function Level of Independence: Independent      Comments: Pt reports that he drives and does his own grocery shopping.  PT Goals (current goals can now be found in the care plan section) Acute Rehab PT Goals Patient Stated Goal: Return home PT Goal Formulation: With patient Time For Goal Achievement: 08/28/18 Potential to Achieve Goals: Good Progress towards PT goals: Progressing toward goals    Frequency    Min 3X/week      PT Plan Discharge plan needs to be updated       AM-PAC PT "6 Clicks" Daily Activity  Outcome Measure  Difficulty turning over in bed (including  adjusting bedclothes, sheets and blankets)?: A Little Difficulty moving from lying on back to sitting on the side of the bed? : A Little Difficulty sitting down on and standing up from a chair with arms (e.g., wheelchair, bedside commode, etc,.)?: A Little Help needed moving to and from a bed to chair (including a wheelchair)?: A Little Help needed walking in hospital room?: A Little Help needed climbing 3-5 steps with a railing? : A Lot 6 Click Score: 17    End of Session Equipment Utilized During Treatment: Gait belt;Oxygen Activity Tolerance: Patient tolerated treatment well Patient left: in chair;with call bell/phone within reach;with chair alarm set;with family/visitor present Nurse Communication: Mobility status PT Visit Diagnosis: Other abnormalities of gait and mobility (R26.89)     Time: 1610-9604 PT Time Calculation (min) (ACUTE ONLY): 21 min  Charges:  $Gait Training: 8-22 mins                     Lylian Sanagustin B. Beverely Risen PT, DPT Acute Rehabilitation Services Pager [phone] Office [phone]    Elon Alas Fleet 08/17/2018, 2:35 PM

## 2018-08-17 NOTE — Evaluation (Signed)
Occupational Therapy Evaluation Patient Details Name: Isaiah Brady MRN: [id number] DOB: 1933/08/04 Today's Date: 08/17/2018    History of Present Illness Pt is an 84 y.o. male admitted 08/05/18 with increasing falls; found to have AKI on CKD III. Also with acute respiratory, treated for CPAP; encephalopathy likely metabolic from uremia; new onset a-fib. CT showed now acute intracranial abnormality; chronic ischemic changes with R basal ganglia lacunar stroke. PMH includes COPD, HTN, peripheral neuropathy.   Clinical Impression   Pt admitted with the above diagnoses and presents with below problem list. Pt will benefit from continued acute OT to address the below listed deficits and maximize independence with basic ADLs prior to d/c to next venue. PTA pt was independent with ADLs, drives, and does his own grocery shopping. Pt is currently min guard with LB ADLs and household distance functional mobility, min A for shower transfer. Pt noted to be short of breath at times during session. On 2L supplemental O2 throughout session.     Follow Up Recommendations  SNF    Equipment Recommendations  Other (comment)(defer to next venue)    Recommendations for Other Services       Precautions / Restrictions Precautions Precautions: Fall Restrictions Weight Bearing Restrictions: No      Mobility Bed Mobility               General bed mobility comments: up in recliner  Transfers Overall transfer level: Needs assistance Equipment used: Rolling walker (2 wheeled) Transfers: Sit to/from Stand Sit to Stand: Min guard         General transfer comment: Cues for hand placement and safety, min guard for safety    Balance Overall balance assessment: Needs assistance   Sitting balance-[name] Scale: Good       Standing balance-[name] Scale: Fair Standing balance comment: Able to static stand at sink and perform ADL tasks without UE support                           ADL  either performed or assessed with clinical judgement   ADL Overall ADL's : Needs assistance/impaired Eating/Feeding: Set up;Sitting   Grooming: Min guard;Standing   Upper Body Bathing: Set up;Sitting   Lower Body Bathing: Min guard;Sit to/from stand   Upper Body Dressing : Set up;Sitting   Lower Body Dressing: Min guard;Sit to/from stand   Toilet Transfer: Min guard;Ambulation;Comfort height toilet;Grab bars;RW   Toileting- Architect and Hygiene: Min guard;Sit to/from stand   Tub/ Shower Transfer: Minimal assistance;Ambulation;Rolling walker   Functional mobility during ADLs: Min guard;Rolling walker General ADL Comments: Pt completed household distance functional mobility, grooming tasks in standing.      Vision         Perception     Praxis      Pertinent Vitals/Pain Pain Assessment: No/denies pain     Hand Dominance     Extremity/Trunk Assessment Upper Extremity Assessment Upper Extremity Assessment: Generalized weakness   Lower Extremity Assessment Lower Extremity Assessment: Defer to PT evaluation;Generalized weakness       Communication Communication Communication: HOH   Cognition Arousal/Alertness: Awake/alert Behavior During Therapy: WFL for tasks assessed/performed Overall Cognitive Status: Within Functional Limits for tasks assessed                                     General Comments       Exercises  Shoulder Instructions      Home Living Family/patient expects to be discharged to:: Private residence Living Arrangements: Alone Available Help at Discharge: Family;Other (Comment) Type of Home: House Home Access: Stairs to enter [employer] of Steps: 2 Entrance Stairs-Rails: Right Home Layout: One level               Home Equipment: Walker - 2 wheels;Cane - single point          Prior Functioning/Environment Level of Independence: Independent        Comments: Pt reports that he  drives and does his own grocery shopping.        OT Problem List: Decreased activity tolerance;Impaired balance (sitting and/or standing);Decreased knowledge of use of DME or AE;Decreased knowledge of precautions;Cardiopulmonary status limiting activity      OT Treatment/Interventions: Self-care/ADL training;Energy conservation;DME and/or AE instruction;Therapeutic activities;Patient/family education;Balance training;Therapeutic exercise    OT Goals(Current goals can be found in the care plan section) Acute Rehab OT Goals Patient Stated Goal: rehab then home OT Goal Formulation: With patient Time For Goal Achievement: 08/31/18 Potential to Achieve Goals: Good ADL Goals Pt Will Perform Grooming: with modified independence;standing Pt Will Perform Upper Body Bathing: with modified independence;sitting Pt Will Perform Lower Body Bathing: with modified independence;sit to/from stand Pt Will Perform Upper Body Dressing: with set-up;sitting Pt Will Perform Lower Body Dressing: with modified independence;sit to/from stand Pt Will Transfer to Toilet: with modified independence;ambulating;with supervision Pt Will Perform Toileting - Clothing Manipulation and hygiene: with modified independence;sit to/from stand Pt Will Perform Tub/Shower Transfer: with supervision;ambulating;rolling walker;shower seat  OT Frequency: Min 2X/week   Barriers to D/C:            Co-evaluation              AM-PAC PT "6 Clicks" Daily Activity     Outcome Measure Help from another person eating meals?: None Help from another person taking care of personal grooming?: None Help from another person toileting, which includes using toliet, bedpan, or urinal?: A Little Help from another person bathing (including washing, rinsing, drying)?: A Little Help from another person to put on and taking off regular upper body clothing?: None Help from another person to put on and taking off regular lower body clothing?:  A Little 6 Click Score: 21   End of Session Equipment Utilized During Treatment: Oxygen;Rolling walker  Activity Tolerance: Other (comment)(SOB with moderate activity (walking in room), lightheaded) Patient left: in chair;with call bell/phone within reach  OT Visit Diagnosis: Unsteadiness on feet (R26.81);Muscle weakness (generalized) (M62.81);Other abnormalities of gait and mobility (R26.89)                Time: 4782-9562 OT Time Calculation (min): 12 min Charges:  OT General Charges $OT Visit: 1 Visit OT Evaluation $OT Eval Low Complexity: 1 Low  Raynald Kemp, OT Acute Rehabilitation Services Pager: [phone] Office: [phone]   Pilar Grammes 08/17/2018, 11:43 AM

## 2018-08-17 NOTE — Discharge Summary (Addendum)
Physician Discharge Summary  Isaiah Brady ZOX:[id number] DOB: 1933-09-07 DOA: 08/11/2018  PCP: Richardean Chimera, MD  Admit date: 08/11/2018 Discharge date: 08/17/2018  Admitted From: Home Disposition:  SNF  Recommendations for Outpatient Follow-up:  1. Follow up with PCP in 1-2 weeks 2. Recommend repeat renal panel in 1 week  Discharge Condition:Improved CODE STATUS:DNR Diet recommendation: Heart healthy   Brief/Interim Summary: 84 y.o.malewith medical history significant ofhypertension, chronic respiratory failure due to COPD on 2 L nasal cannula oxygen, GERD, depression with anxiety, peripheral neuropathy, psoriasis, atrial fibrillation on Eliquis, pulmonary hypertension, who presented with AKI/decreased urine output. Patient was recently hospitalized from 9/28-10/3 due to altered mental status, possible pneumonia and AKI. Patient had creatinine 5.2, treated with CRRT, creatinine improved to 1.75 at discharge. Hecompleted 5-day antibiotic treatment for PNA.Marland Kitchen Pt wasjust discharged 10/3 and readmitted 10/4 with AKI and decreased UO -hydrated, creatinine improving, nephrology following  AKI on stage 3 ckd (acute kidney injury)  -Unclear etiology. Suspicion for medication effect from ARB discontinued prior to admit -Nephrology following -Last echo with preserved EF and moderate pulmonary hypertension PA pressure 60 -Urinalysis with hyaline casts, then improving with gentle hydration downfrom 4.8 toto1 7 today,discontinue IV fluids -Renal US with medical renal dz, Renalartery duplex with no obvious renal artery stenosis -Renal dosing of medications, cut down gabapentin and lorazepam dose -Recent admission last week with acute kidney injury likely hemodynamically mediated and worsened by ARB, required CRRT x2 9/28 and 9/29 -Cr has improved  Leukocytosis -No clear source of infection at this time -Remains afebrile, exam without localizing infection -Leukocytosis had  improved  R arm weakness -noted to be ongoing for 2-26months per pt -MRI brainnoted severe chronic microvascular ischemia, no acute findings,has Afib -Plan for SNF at time of discharge  Atrial Fibrillation: CHA2DS2-VASc Scoreis 5 -Given improvement in kidney function have resumed Eliquis -Continue metoprololand cardizem as tolerated -Recent CT with evidence of prior strokes, on exam has right arm weakness noted, onset unclear per son, patient thinks it started 2-25months ago after he fell and injured his right shoulder,MRIbrain noted to have no acute findings  COPD (chronic obstructive pulmonary disease)andchronic respiratory failurewith hypoxia: -Baseline O2 requirements of 2 L nasal cannula oxygen at home. -stable, continue bronchodilators as needed  Depression and anxiety: -Continue home medications: -Stable at present  Psoriasis: -Continue home prednisone -Transition back to basal dose -Appears to be stable at present  HTN:  -Continue home medications:Metoprolol, Cardizem -BP stable, albeit suboptimally controlled  GERD: -Protonix while in hospital -Stable at this time  Discharge Diagnoses:  Principal Problem:   AKI (acute kidney injury) (HCC) Active Problems:   Essential hypertension, benign   GERD (gastroesophageal reflux disease)   Paroxysmal atrial fibrillation (HCC)   COPD (chronic obstructive pulmonary disease) (HCC)   Depression with anxiety   Leukocytosis   Chronic respiratory failure with hypoxia (HCC)   Psoriasis   Palliative care by specialist   Goals of care, counseling/discussion    Discharge Instructions   Allergies as of 08/17/2018   No Known Allergies     Medication List    STOP taking these medications   ALPRAZolam 1 MG tablet Commonly known as:  XANAX   gabapentin 300 MG capsule Commonly known as:  NEURONTIN   traMADol 50 MG tablet Commonly known as:  ULTRAM     TAKE these medications   acetaminophen 325  MG tablet Commonly known as:  TYLENOL Take 325-650 mg by mouth every 6 (six) hours as needed (for pain or  headaches).   alendronate 70 MG tablet Commonly known as:  FOSAMAX Take 70 mg by mouth every Sunday. Take with a full glass of water on an empty stomach.   amitriptyline 50 MG tablet Commonly known as:  ELAVIL Take 50 mg by mouth at bedtime.   ANORO ELLIPTA 62.5-25 MCG/INH Aepb Generic drug:  umeclidinium-vilanterol Inhale 1 puff into the lungs daily.   apixaban 5 MG Tabs tablet Commonly known as:  ELIQUIS Take 1 tablet (5 mg total) by mouth 2 (two) times daily. What changed:    medication strength  how much to take   diltiazem 240 MG 24 hr capsule Commonly known as:  CARDIZEM CD Take 240 mg by mouth daily. What changed:  Another medication with the same name was removed. Continue taking this medication, and follow the directions you see here.   fish oil-omega-3 fatty acids 1000 MG capsule Take 1 g by mouth 2 (two) times daily before a meal.   FLUoxetine 20 MG capsule Commonly known as:  PROZAC Take 20 mg by mouth daily.   metoprolol tartrate 25 MG tablet Commonly known as:  LOPRESSOR Take 1 tablet (25 mg total) by mouth 2 (two) times daily. What changed:    medication strength  how much to take   omeprazole 20 MG capsule Commonly known as:  PRILOSEC Take 20 mg by mouth 2 (two) times daily before a meal.   polyethylene glycol packet Commonly known as:  MIRALAX / GLYCOLAX Take 17 g by mouth daily. Start taking on:  08/18/2018   predniSONE 5 MG tablet Commonly known as:  DELTASONE Take 5 mg by mouth 2 (two) times daily with a meal.   senna-docusate 8.6-50 MG tablet Commonly known as:  Senokot-S Take 2 tablets by mouth 2 (two) times daily.      Follow-up Information    Richardean Chimera, MD. Schedule an appointment as soon as possible for a visit in 2 week(s).   Specialty:  Family Medicine Contact information: [address] [place]  [address] [phone]          No Known Allergies  Consultations:  nephrology  Procedures/Studies: Ct Head Wo Contrast  Result Date: 08/07/2018 CLINICAL DATA:  Confusion and ataxia. Head trauma. History of hypertension, encephalopathy. EXAM: CT HEAD WITHOUT CONTRAST TECHNIQUE: Contiguous axial images were obtained from the base of the skull through the vertex without intravenous contrast. COMPARISON:  None. FINDINGS: BRAIN: No intraparenchymal hemorrhage, mass effect nor midline shift. The ventricles and sulci are normal for age. Patchy to confluent supratentorial white matter hypodensities. 1 cm hypodensity RIGHT inferior basal ganglia. No acute large vascular territory infarcts. No abnormal extra-axial fluid collections. Basal cisterns are patent. VASCULAR: Moderate calcific atherosclerosis of the carotid siphons. SKULL: No skull fracture. No significant scalp soft tissue swelling. SINUSES/ORBITS: Trace paranasal sinus mucosal thickening. Mastoid air cells are well aerated.The included ocular globes and orbital contents are non-suspicious. Status post LEFT ocular lens implant. OTHER: None. IMPRESSION: 1. No acute intracranial process. 2. Moderate to severe chronic small vessel ischemic changes. RIGHT basal ganglia lacunar infarct versus perivascular space. Electronically Signed   By: Awilda Metro M.D.   On: 08/07/2018 00:21   Mr Brain Wo Contrast  Result Date: 08/14/2018 CLINICAL DATA:  Altered mental status. EXAM: MRI HEAD WITHOUT CONTRAST TECHNIQUE: Multiplanar, multiecho pulse sequences of the brain and surrounding structures were obtained without intravenous contrast. COMPARISON:  Head CT 08/07/2018 FINDINGS: BRAIN: There is no acute infarct, acute hemorrhage, hydrocephalus or extra-axial collection.  The midline structures are normal. No midline shift or other mass effect. 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 Diffuse confluent hyperintense T2-weighted signal within the  periventricular, deep and juxtacortical white matter, most commonly due to chronic ischemic microangiopathy. Generalized atrophy without lobar predilection. Susceptibility-sensitive sequences show no chronic microhemorrhage or superficial siderosis. VASCULAR: Major intracranial arterial and venous sinus flow voids are normal. SKULL AND UPPER CERVICAL SPINE: Calvarial bone marrow signal is normal. There is no skull base mass. Visualized upper cervical spine and soft tissues are normal. SINUSES/ORBITS: No fluid levels or advanced mucosal thickening. No mastoid or middle ear effusion. The orbits are normal. IMPRESSION: 1. No acute intracranial abnormality. 2. Severe chronic microvascular ischemia. Electronically Signed   By: Deatra Robinson M.D.   On: 08/14/2018 22:48   US Renal  Result Date: 08/11/2018 CLINICAL DATA:  Acute renal disease. EXAM: RENAL / URINARY TRACT ULTRASOUND COMPLETE COMPARISON:  Ultrasound August 06, 2018 FINDINGS: Right Kidney: Length: 12.6 cm. Cortical thinning measuring 9 mm. No hydronephrosis. Left Kidney: Length: 10.2 cm. Cortical thinning measuring 9 mm. No hydronephrosis. Bladder: Appears normal for degree of bladder distention. IMPRESSION: 1. Increased cortical echogenicity and cortical thinning bilaterally. Findings are consistent medical renal disease. No acute abnormality. Electronically Signed   By: Gerome Sam III M.D   On: 08/11/2018 20:44   US Renal  Result Date: 08/06/2018 CLINICAL DATA:  Acute kidney injury EXAM: RENAL / URINARY TRACT ULTRASOUND COMPLETE COMPARISON:  None. FINDINGS: Right Kidney: Length: 12.9 cm. Echogenic renal parenchyma. Cortical thinning. No mass or hydronephrosis. Left Kidney: Length: 10.8 cm. Echogenic renal parenchyma. Cortical thinning/atrophy. No mass or hydronephrosis. Bladder: Not discretely visualized/underdistended. IMPRESSION: Bilateral renal cortical thinning/atrophy, left greater than right. Echogenic renal parenchyma, suggesting  medical renal disease. No hydronephrosis. Bladder is not discretely visualized/underdistended. Electronically Signed   By: Charline Bills M.D.   On: 08/06/2018 01:59   Dg Chest Port 1 View  Result Date: 08/07/2018 CLINICAL DATA:  Community acquired pneumonia EXAM: PORTABLE CHEST 1 VIEW COMPARISON:  08/05/2018 FINDINGS: Right Vas-Cath remains in place, unchanged. Cardiomegaly. Bibasilar atelectasis. Mild vascular congestion. No visible effusions or acute bony abnormality. IMPRESSION: Cardiomegaly with vascular congestion.  Bibasilar atelectasis. Electronically Signed   By: Charlett Nose M.D.   On: 08/07/2018 07:22   Dg Chest Port 1 View  Result Date: 08/05/2018 CLINICAL DATA:  Central venous catheter placement EXAM: PORTABLE CHEST 1 VIEW COMPARISON:  08/05/2018, 08/04/2018 FINDINGS: Placement of right-sided central venous catheter with tip superimposing the jugular brachiocephalic confluence. No right pneumothorax. Continued cardiomegaly with streaky atelectasis or infiltrate at the left base, slightly improved. IMPRESSION: 1. Right central venous catheter tip overlies the jugular/brachiocephalic confluence. There is no right pneumothorax 2. Cardiomegaly. Streaky atelectasis or infiltrate at the left base with some improvement in aeration. Electronically Signed   By: Jasmine Pang M.D.   On: 08/05/2018 21:53   Dg Chest Port 1 View  Result Date: 08/05/2018 CLINICAL DATA:  Shortness of breath EXAM: PORTABLE CHEST 1 VIEW COMPARISON:  August 04, 2018 FINDINGS: There is airspace consolidation in the left base with small left pleural effusion. Lungs elsewhere are clear. Heart is mildly enlarged with pulmonary vascularity normal. No adenopathy. There is aortic atherosclerosis. No evident bone lesions. IMPRESSION: Airspace consolidation left base consistent with pneumonia. Small left pleural effusion. Lungs elsewhere clear. Stable cardiomegaly. There is aortic atherosclerosis. Aortic Atherosclerosis  (ICD10-I70.0). Electronically Signed   By: Bretta Bang III M.D.   On: 08/05/2018 14:13     Subjective:  Eager to start therapy at SNF  Discharge Exam: Vitals:   08/17/18 0500 08/17/18 1018  BP: (!) 162/58 (!) 180/74  Pulse: 60 69  Resp: 18 18  Temp: 98.2 F (36.8 C) 98.4 F (36.9 C)  SpO2: 100% 98%   Vitals:   08/16/18 1735 08/16/18 2022 08/17/18 0500 08/17/18 1018  BP: (!) 164/68 (!) 163/63 (!) 162/58 (!) 180/74  Pulse: (!) 57 63 60 69  Resp: 18 18 18 18   Temp: 97.6 F (36.4 C) 98 F (36.7 C) 98.2 F (36.8 C) 98.4 F (36.9 C)  TempSrc: Oral Oral Oral Oral  SpO2: 100% 100% 100% 98%  Weight:      Height:        General: Pt is alert, awake, not in acute distress Cardiovascular: RRR, S1/S2 +, no rubs, no gallops Respiratory: CTA bilaterally, no wheezing, no rhonchi Abdominal: Soft, NT, ND, bowel sounds + Extremities: no edema, no cyanosis   The results of significant diagnostics from this hospitalization (including imaging, microbiology, ancillary and laboratory) are listed below for reference.     Microbiology: Recent Results (from the past 240 hour(s))  Culture, blood (Routine X 2) w Reflex to ID Panel     Status: None   Collection Time: 08/11/18  7:52 PM  Result Value Ref Range Status   Specimen Description BLOOD RIGHT FOREARM  Final   Special Requests   Final    BOTTLES DRAWN AEROBIC AND ANAEROBIC Blood Culture adequate volume   Culture   Final    NO GROWTH 5 DAYS Performed at [facility], [address], [place], [place] [address]    Report Status 08/16/2018 FINAL  Final  Culture, blood (Routine X 2) w Reflex to ID Panel     Status: None   Collection Time: 08/11/18  7:57 PM  Result Value Ref Range Status   Specimen Description BLOOD LEFT ANTECUBITAL  Final   Special Requests   Final    BOTTLES DRAWN AEROBIC AND ANAEROBIC Blood Culture adequate volume   Culture   Final    NO GROWTH 5 DAYS Performed at [facility], [address], [place], [place] [address]    Report Status 08/16/2018 FINAL  Final     Labs: BNP (last 3 results) No results for input(s): BNP in the last 8760 hours. Basic Metabolic Panel: Recent Labs  Lab 08/13/18 0440 08/14/18 0433 08/15/18 0534 08/16/18 0525 08/17/18 0442  NA 135 140 144 140 140  K 4.1 4.5 4.8 4.9 5.0  CL 97* 107 110 106 103  CO2 25 24 28 27 28   GLUCOSE 139* 176* 138* 113* 103*  BUN 92* 84* 66* 54* 40*  CREATININE 3.66* 2.61* 1.75* 1.50* 1.18  CALCIUM 6.9* 6.8* 7.3* 7.8* 8.1*   Liver Function Tests: Recent Labs  Lab 08/11/18 1619 08/11/18 1808  AST 86* 29  ALT 12 24  ALKPHOS 62 62  BILITOT 2.5* 0.8  PROT 6.2* 6.6  ALBUMIN 3.3* 3.4*   No results for input(s): LIPASE, AMYLASE in the last 168 hours. No results for input(s): AMMONIA in the last 168 hours. CBC: Recent Labs  Lab 08/12/18 0614 08/13/18 0440 08/14/18 0433 08/15/18 0534 08/16/18 0525 08/17/18 0442  WBC 17.9* 14.3* 12.6* 14.1* 14.1* 15.0*  NEUTROABS 9.8*  --   --   --   --   --   HGB 13.2 11.8* 10.7* 11.2* 11.0* 11.3*  HCT 42.5 38.2* 34.6* 36.8* 35.6* 37.9*  MCV 95.9 97.0 97.7 97.4  96.0 95.9  PLT 202 201 191 204 205 244   Cardiac Enzymes: No results for input(s): CKTOTAL, CKMB, CKMBINDEX, TROPONINI in the last 168 hours. BNP: Invalid input(s): POCBNP CBG: No results for input(s): GLUCAP in the last 168 hours. D-Dimer No results for input(s): DDIMER in the last 72 hours. Hgb A1c No results for input(s): HGBA1C in the last 72 hours. Lipid Profile No results for input(s): CHOL, HDL, LDLCALC, TRIG, CHOLHDL, LDLDIRECT in the last 72 hours. Thyroid function studies No results for input(s): TSH, T4TOTAL, T3FREE, THYROIDAB in the last 72 hours.  Invalid input(s): FREET3 Anemia work up No results for input(s): VITAMINB12, FOLATE, FERRITIN, TIBC, IRON, RETICCTPCT in the last 72 hours. Urinalysis    Component Value Date/Time   COLORURINE AMBER (A) 08/11/2018 2316   APPEARANCEUR HAZY (A)  08/11/2018 2316   LABSPEC 1.018 08/11/2018 2316   PHURINE 5.0 08/11/2018 2316   GLUCOSEU NEGATIVE 08/11/2018 2316   HGBUR NEGATIVE 08/11/2018 2316   BILIRUBINUR NEGATIVE 08/11/2018 2316   KETONESUR 5 (A) 08/11/2018 2316   PROTEINUR 100 (A) 08/11/2018 2316   NITRITE NEGATIVE 08/11/2018 2316   LEUKOCYTESUR NEGATIVE 08/11/2018 2316   Sepsis Labs Invalid input(s): PROCALCITONIN,  WBC,  LACTICIDVEN Microbiology Recent Results (from the past 240 hour(s))  Culture, blood (Routine X 2) w Reflex to ID Panel     Status: None   Collection Time: 08/11/18  7:52 PM  Result Value Ref Range Status   Specimen Description BLOOD RIGHT FOREARM  Final   Special Requests   Final    BOTTLES DRAWN AEROBIC AND ANAEROBIC Blood Culture adequate volume   Culture   Final    NO GROWTH 5 DAYS Performed at [facility], [address], [place], [place] [address]    Report Status 08/16/2018 FINAL  Final  Culture, blood (Routine X 2) w Reflex to ID Panel     Status: None   Collection Time: 08/11/18  7:57 PM  Result Value Ref Range Status   Specimen Description BLOOD LEFT ANTECUBITAL  Final   Special Requests   Final    BOTTLES DRAWN AEROBIC AND ANAEROBIC Blood Culture adequate volume   Culture   Final    NO GROWTH 5 DAYS Performed at [facility], [address] [place], [place] [address]    Report Status 08/16/2018 FINAL  Final   Time spent: 30 min  SIGNED:   Rickey Barbara, MD  [facility] 08/17/2018, 1:27 PM  If 7PM-7AM, please contact night-coverage

## 2018-08-17 NOTE — Progress Notes (Signed)
ANTICOAGULATION CONSULT NOTE - Follow Up Consult  Pharmacy Consult for Apixaban Indication: atrial fibrillation  No Known Allergies  Vital Signs: Temp: 98.2 F (36.8 C) (10/10 0500) Temp Source: Oral (10/10 0500) BP: 162/58 (10/10 0500) Pulse Rate: 60 (10/10 0500)  Labs: Recent Labs    08/15/18 0534 08/16/18 0525 08/17/18 0442  HGB 11.2* 11.0* 11.3*  HCT 36.8* 35.6* 37.9*  PLT 204 205 244  APTT 92*  --   --   HEPARINUNFRC 1.10*  --   --   CREATININE 1.75* 1.50* 1.18    Estimated Creatinine Clearance: 54.7 mL/min (by C-G formula based on SCr of 1.18 mg/dL).   Assessment: 84 year old male on apixaban.  Scr now down to 1.18 with age >34, and weight >60kg, therefore will increase to full dose.    Goal of Therapy:  Monitor platelets by anticoagulation protocol: Yes     Plan:  Increase apixaban to 5mg  PO BID this AM    Yianna Tersigni A. Jeanella Craze, PharmD, BCPS Clinical Pharmacist [place] Pager: [phone] Please utilize Amion for appropriate phone number to reach the unit pharmacist [facility])   08/17/2018,8:14 AM

## 2018-08-17 NOTE — Progress Notes (Signed)
Report  Given to [facility]der at [facility]

## 2018-08-18 DIAGNOSIS — E875 Hyperkalemia: Secondary | ICD-10-CM | POA: Diagnosis not present

## 2018-08-18 DIAGNOSIS — I480 Paroxysmal atrial fibrillation: Secondary | ICD-10-CM | POA: Diagnosis not present

## 2018-08-18 DIAGNOSIS — N170 Acute kidney failure with tubular necrosis: Secondary | ICD-10-CM | POA: Diagnosis not present

## 2018-08-20 DIAGNOSIS — N170 Acute kidney failure with tubular necrosis: Secondary | ICD-10-CM | POA: Diagnosis not present

## 2018-08-20 DIAGNOSIS — I480 Paroxysmal atrial fibrillation: Secondary | ICD-10-CM | POA: Diagnosis not present

## 2018-08-27 DIAGNOSIS — E875 Hyperkalemia: Secondary | ICD-10-CM | POA: Diagnosis not present

## 2018-08-27 DIAGNOSIS — I480 Paroxysmal atrial fibrillation: Secondary | ICD-10-CM | POA: Diagnosis not present

## 2018-08-27 DIAGNOSIS — N170 Acute kidney failure with tubular necrosis: Secondary | ICD-10-CM | POA: Diagnosis not present

## 2018-08-28 ENCOUNTER — Encounter: Payer: Self-pay | Admitting: Cardiology

## 2018-08-28 DIAGNOSIS — J449 Chronic obstructive pulmonary disease, unspecified: Secondary | ICD-10-CM | POA: Diagnosis not present

## 2018-08-28 NOTE — Progress Notes (Signed)
Cardiology Office Note  Date: 08/29/2018   ID: THOAMS SIEFERT, DOB Jan 20, 1933, MRN [id number]  PCP: Isaiah Chimera, MD  Evaluating Cardiologist: Isaiah Dell, MD   Chief Complaint  Patient presents with  . Hospitalization Follow-up    History of Present Illness: Isaiah Brady is an 84 y.o. male presenting the to the office the first time after recent inpatient consultation with Dr. Jacques Brady at [facility].  This is my first meeting with him, I reviewed his records.  He was seen recently in the setting of acute renal insufficiency and hyperkalemia with transiently documented atrial fibrillation and CHADSVASC score of 5.  Neuroimaging showed evidence of previous old right basal ganglia infarct raising possibility of undiagnosed PAF.  He spontaneously converted to sinus rhythm and was ultimately placed on Eliquis, Cardizem CD, and Lopressor.  He is here today with his son, continues in rehabilitation at the [facility] nursing center.  He is in a wheelchair.  His son states that he has been making some progress, but does have intermittent confusion.  [name] does not report any chest pain or palpitations.  I reviewed his medications, cardiac regimen includes Eliquis, Cardizem CD, and Lopressor.  Heart rate is regular today.  He does not report any bleeding problems.  Past Medical History:  Diagnosis Date  . COPD (chronic obstructive pulmonary disease) (HCC)   . Essential hypertension   . Paroxysmal atrial fibrillation [facility])    Documented October 2019  . Peripheral neuropathy     Past Surgical History:  Procedure Laterality Date  . ESOPHAGOGASTRODUODENOSCOPY N/A 03/23/2013   Procedure: ESOPHAGOGASTRODUODENOSCOPY (EGD);  Surgeon: Malissa Hippo, MD;  Location: [facility];  Service: Endoscopy;  Laterality: N/A;  1120  . TONSILLECTOMY      Current Outpatient Medications  Medication Sig Dispense Refill  . acetaminophen (TYLENOL) 325 MG tablet Take 325-650 mg  by mouth every 6 (six) hours as needed (for pain or headaches).    Marland Kitchen alendronate (FOSAMAX) 70 MG tablet Take 70 mg by mouth every Sunday. Take with a full glass of water on an empty stomach.    . ALPRAZolam (XANAX) 0.5 MG tablet Take 0.5 mg by mouth at bedtime as needed for anxiety.    Marland Kitchen amitriptyline (ELAVIL) 50 MG tablet Take 50 mg by mouth at bedtime.    Marland Kitchen apixaban (ELIQUIS) 5 MG TABS tablet Take 1 tablet (5 mg total) by mouth 2 (two) times daily. 60 tablet 0  . diltiazem (CARDIZEM CD) 240 MG 24 hr capsule Take 240 mg by mouth daily.    . fish oil-omega-3 fatty acids 1000 MG capsule Take 1 g by mouth 2 (two) times daily before a meal.     . FLUoxetine (PROZAC) 20 MG capsule Take 20 mg by mouth daily.    . metoprolol tartrate (LOPRESSOR) 25 MG tablet Take 1 tablet (25 mg total) by mouth 2 (two) times daily. 60 tablet 0  . omeprazole (PRILOSEC) 20 MG capsule Take 20 mg by mouth 2 (two) times daily before a meal.    . polyethylene glycol (MIRALAX / GLYCOLAX) packet Take 17 g by mouth daily. 14 each 0  . predniSONE (DELTASONE) 5 MG tablet Take 5 mg by mouth 2 (two) times daily with a meal.    . senna-docusate (SENOKOT-S) 8.6-50 MG tablet Take 2 tablets by mouth 2 (two) times daily. 60 tablet   . umeclidinium-vilanterol (ANORO ELLIPTA) 62.5-25 MCG/INH AEPB Inhale 1 puff into the lungs daily.  No current facility-administered medications for this visit.    Allergies:  Patient has no known allergies.   Social History: The patient  reports that he has quit smoking. His smoking use included cigarettes. He has never used smokeless tobacco. He reports that he does not drink alcohol or use drugs.   Family History: The patient's family history includes CAD in his brother; Leukemia in his mother; Other in his brother; Stroke in his brother; Suicidality in his brother, brother, and father.   ROS:  Please see the history of present illness. Otherwise, complete review of systems is positive for hearing  loss, weakness.  All other systems are reviewed and negative.   Physical Exam: VS:  BP 130/62   Pulse 74   Ht 5\' 10"  (1.778 m)   Wt 210 lb (95.3 kg)   SpO2 95%   BMI 30.13 kg/m , BMI Body mass index is 30.13 kg/m.  Wt Readings from Last 3 Encounters:  08/29/18 210 lb (95.3 kg)  08/15/18 207 lb 10.8 oz (94.2 kg)  08/08/18 208 lb 1.8 oz (94.4 kg)    General: Chronically ill-appearing male in wheelchair, appears comfortable. HEENT: Conjunctiva and lids normal, oropharynx clear. Neck: Supple, no elevated JVP or carotid bruits, no thyromegaly. Lungs: Clear to auscultation, nonlabored breathing at rest. Cardiac: Regular rate and rhythm, no S3, soft systolic murmur. Abdomen: Soft, nontender, bowel sounds present. Extremities: Trace ankle edema, distal pulses 2+. Skin: Warm and dry. Musculoskeletal: No kyphosis. Neuropsychiatric: Alert and oriented x3, affect grossly appropriate.  ECG: I personally reviewed the tracing from 08/11/2018 which showed sinus rhythm with right bundle branch block.  Recent Labwork: 08/08/2018: Magnesium 1.8 08/11/2018: ALT 24; AST 29 08/17/2018: BUN 40; Creatinine, Ser 1.18; Hemoglobin 11.3; Platelets 244; Potassium 5.0; Sodium 140     Component Value Date/Time   CHOL 143 08/08/2018 0341   TRIG 223 (H) 08/08/2018 0341   HDL 38 (L) 08/08/2018 0341   CHOLHDL 3.8 08/08/2018 0341   VLDL 45 (H) 08/08/2018 0341   LDLCALC 60 08/08/2018 0341    Other Studies Reviewed Today:  Echocardiogram 08/06/2018: Study Conclusions  - Left ventricle: The cavity size was normal. Systolic function was   normal. The estimated ejection fraction was in the range of 60%   to 65%. Although no diagnostic regional wall motion abnormality   was identified, this possibility cannot be completely excluded on   the basis of this study. - Tricuspid valve: There was moderate regurgitation. - Pulmonary arteries: Systolic pressure was moderately increased.   PA peak pressure: 61 mm  Hg (S), assuming mean RA pressure 8 mm   Hg. Unable to see the inferior vena cava.  Renal artery Dopplers 08/14/2018: FINAL INTERPRETATION: Renal:  Right: Abnormal right Resistive Index. No evidence of right renal    artery stenosis. Left: Abnormal left Resisitve Index. No evidence of left renal    artery stenosis.  Assessment and Plan:  1.  Paroxysmal atrial fibrillation with CHADSVASC score of 5.  Recently placed on Eliquis after cardiology consultation at [facility].  He reports no bleeding problems.  Otherwise plan to continue Cardizem CD and Lopressor.  He does not report any chest pain or palpitations.  Follow-up CBC and BMET for next visit.  2.  Essential hypertension, blood pressure is adequately controlled today.  Keep follow-up with Dr. Reuel Boom.  3.  History of prior stroke based on neuroimaging.  Current medicines were reviewed with the patient today.   Orders Placed This Encounter  Procedures  .  Basic metabolic panel  . CBC    Disposition: Follow-up in 8 weeks.  Signed, Jonelle Sidle, MD, [facility] 08/29/2018 9:09 AM    [facility] [address] [place], [place], [place] [address] Phone: [phone]; Fax: [phone]

## 2018-08-29 ENCOUNTER — Encounter: Payer: Self-pay | Admitting: Cardiology

## 2018-08-29 ENCOUNTER — Ambulatory Visit (INDEPENDENT_AMBULATORY_CARE_PROVIDER_SITE_OTHER): Payer: Medicare Other | Admitting: Cardiology

## 2018-08-29 VITALS — BP 130/62 | HR 74 | Ht 70.0 in | Wt 210.0 lb

## 2018-08-29 DIAGNOSIS — Z8673 Personal history of transient ischemic attack (TIA), and cerebral infarction without residual deficits: Secondary | ICD-10-CM | POA: Diagnosis not present

## 2018-08-29 DIAGNOSIS — I10 Essential (primary) hypertension: Secondary | ICD-10-CM | POA: Diagnosis not present

## 2018-08-29 DIAGNOSIS — Z79899 Other long term (current) drug therapy: Secondary | ICD-10-CM

## 2018-08-29 DIAGNOSIS — I480 Paroxysmal atrial fibrillation: Secondary | ICD-10-CM | POA: Diagnosis not present

## 2018-08-29 NOTE — Patient Instructions (Addendum)
Medication Instructions:   Your physician recommends that you continue on your current medications as directed. Please refer to the Current Medication list given to you today.  Labwork:  Your physician recommends that you return for lab work in: 8 weeks just before your next visit to check a BMET & CBC.-lab order given during visit.  Testing/Procedures:  NONE  Follow-Up:  Your physician recommends that you schedule a follow-up appointment in: 8 weeks.  Any Other Special Instructions Will Be Listed Below (If Applicable).  If you need a refill on your cardiac medications before your next appointment, please call your pharmacy.

## 2018-09-02 DIAGNOSIS — M1A9XX0 Chronic gout, unspecified, without tophus (tophi): Secondary | ICD-10-CM | POA: Diagnosis not present

## 2018-09-02 DIAGNOSIS — J9611 Chronic respiratory failure with hypoxia: Secondary | ICD-10-CM | POA: Diagnosis not present

## 2018-09-06 DIAGNOSIS — Z6832 Body mass index (BMI) 32.0-32.9, adult: Secondary | ICD-10-CM | POA: Diagnosis not present

## 2018-09-06 DIAGNOSIS — N179 Acute kidney failure, unspecified: Secondary | ICD-10-CM | POA: Diagnosis not present

## 2018-09-06 DIAGNOSIS — I4891 Unspecified atrial fibrillation: Secondary | ICD-10-CM | POA: Diagnosis not present

## 2018-09-22 DIAGNOSIS — R600 Localized edema: Secondary | ICD-10-CM | POA: Diagnosis not present

## 2018-09-22 DIAGNOSIS — J449 Chronic obstructive pulmonary disease, unspecified: Secondary | ICD-10-CM | POA: Diagnosis not present

## 2018-09-22 DIAGNOSIS — E782 Mixed hyperlipidemia: Secondary | ICD-10-CM | POA: Diagnosis not present

## 2018-09-22 DIAGNOSIS — N179 Acute kidney failure, unspecified: Secondary | ICD-10-CM | POA: Diagnosis not present

## 2018-09-25 ENCOUNTER — Other Ambulatory Visit (HOSPITAL_COMMUNITY): Payer: Self-pay | Admitting: Family Medicine

## 2018-09-25 DIAGNOSIS — R609 Edema, unspecified: Secondary | ICD-10-CM

## 2018-09-28 DIAGNOSIS — J449 Chronic obstructive pulmonary disease, unspecified: Secondary | ICD-10-CM | POA: Diagnosis not present

## 2018-09-29 DIAGNOSIS — N179 Acute kidney failure, unspecified: Secondary | ICD-10-CM | POA: Diagnosis not present

## 2018-09-29 DIAGNOSIS — J449 Chronic obstructive pulmonary disease, unspecified: Secondary | ICD-10-CM | POA: Diagnosis not present

## 2018-09-29 DIAGNOSIS — E782 Mixed hyperlipidemia: Secondary | ICD-10-CM | POA: Diagnosis not present

## 2018-09-29 DIAGNOSIS — R600 Localized edema: Secondary | ICD-10-CM | POA: Diagnosis not present

## 2018-10-02 ENCOUNTER — Ambulatory Visit (HOSPITAL_COMMUNITY): Payer: Medicare Other | Attending: Family Medicine

## 2018-10-02 DIAGNOSIS — N179 Acute kidney failure, unspecified: Secondary | ICD-10-CM | POA: Diagnosis not present

## 2018-10-02 DIAGNOSIS — Z6832 Body mass index (BMI) 32.0-32.9, adult: Secondary | ICD-10-CM | POA: Diagnosis not present

## 2018-10-02 DIAGNOSIS — I4891 Unspecified atrial fibrillation: Secondary | ICD-10-CM | POA: Diagnosis not present

## 2018-10-07 DIAGNOSIS — I10 Essential (primary) hypertension: Secondary | ICD-10-CM | POA: Diagnosis not present

## 2018-10-07 DIAGNOSIS — F331 Major depressive disorder, recurrent, moderate: Secondary | ICD-10-CM | POA: Diagnosis not present

## 2018-10-07 DIAGNOSIS — J449 Chronic obstructive pulmonary disease, unspecified: Secondary | ICD-10-CM | POA: Diagnosis not present

## 2018-10-27 ENCOUNTER — Ambulatory Visit: Payer: Medicare Other | Admitting: Cardiology

## 2018-10-28 DIAGNOSIS — J449 Chronic obstructive pulmonary disease, unspecified: Secondary | ICD-10-CM | POA: Diagnosis not present

## 2018-11-06 DIAGNOSIS — K219 Gastro-esophageal reflux disease without esophagitis: Secondary | ICD-10-CM | POA: Diagnosis not present

## 2018-11-06 DIAGNOSIS — I10 Essential (primary) hypertension: Secondary | ICD-10-CM | POA: Diagnosis not present

## 2018-11-06 DIAGNOSIS — E782 Mixed hyperlipidemia: Secondary | ICD-10-CM | POA: Diagnosis not present

## 2018-11-06 DIAGNOSIS — F331 Major depressive disorder, recurrent, moderate: Secondary | ICD-10-CM | POA: Diagnosis not present

## 2018-11-28 DIAGNOSIS — J449 Chronic obstructive pulmonary disease, unspecified: Secondary | ICD-10-CM | POA: Diagnosis not present

## 2018-12-07 DIAGNOSIS — E782 Mixed hyperlipidemia: Secondary | ICD-10-CM | POA: Diagnosis not present

## 2018-12-07 DIAGNOSIS — F331 Major depressive disorder, recurrent, moderate: Secondary | ICD-10-CM | POA: Diagnosis not present

## 2018-12-07 DIAGNOSIS — I10 Essential (primary) hypertension: Secondary | ICD-10-CM | POA: Diagnosis not present

## 2018-12-14 DIAGNOSIS — E8881 Metabolic syndrome: Secondary | ICD-10-CM | POA: Diagnosis not present

## 2018-12-14 DIAGNOSIS — E782 Mixed hyperlipidemia: Secondary | ICD-10-CM | POA: Diagnosis not present

## 2018-12-14 DIAGNOSIS — E1165 Type 2 diabetes mellitus with hyperglycemia: Secondary | ICD-10-CM | POA: Diagnosis not present

## 2018-12-14 DIAGNOSIS — Z9189 Other specified personal risk factors, not elsewhere classified: Secondary | ICD-10-CM | POA: Diagnosis not present

## 2018-12-18 DIAGNOSIS — Z0001 Encounter for general adult medical examination with abnormal findings: Secondary | ICD-10-CM | POA: Diagnosis not present

## 2018-12-18 DIAGNOSIS — Z6830 Body mass index (BMI) 30.0-30.9, adult: Secondary | ICD-10-CM | POA: Diagnosis not present

## 2018-12-18 DIAGNOSIS — I10 Essential (primary) hypertension: Secondary | ICD-10-CM | POA: Diagnosis not present

## 2018-12-25 DIAGNOSIS — N183 Chronic kidney disease, stage 3 (moderate): Secondary | ICD-10-CM | POA: Diagnosis not present

## 2018-12-29 DIAGNOSIS — J449 Chronic obstructive pulmonary disease, unspecified: Secondary | ICD-10-CM | POA: Diagnosis not present

## 2019-01-27 DIAGNOSIS — J449 Chronic obstructive pulmonary disease, unspecified: Secondary | ICD-10-CM | POA: Diagnosis not present

## 2019-02-27 DIAGNOSIS — J449 Chronic obstructive pulmonary disease, unspecified: Secondary | ICD-10-CM | POA: Diagnosis not present

## 2019-03-07 DIAGNOSIS — N183 Chronic kidney disease, stage 3 (moderate): Secondary | ICD-10-CM | POA: Diagnosis not present

## 2019-03-07 DIAGNOSIS — I4891 Unspecified atrial fibrillation: Secondary | ICD-10-CM | POA: Diagnosis not present

## 2019-03-07 DIAGNOSIS — K210 Gastro-esophageal reflux disease with esophagitis: Secondary | ICD-10-CM | POA: Diagnosis not present

## 2019-03-07 DIAGNOSIS — E039 Hypothyroidism, unspecified: Secondary | ICD-10-CM | POA: Diagnosis not present

## 2019-03-08 DIAGNOSIS — I10 Essential (primary) hypertension: Secondary | ICD-10-CM | POA: Diagnosis not present

## 2019-03-08 DIAGNOSIS — F331 Major depressive disorder, recurrent, moderate: Secondary | ICD-10-CM | POA: Diagnosis not present

## 2019-03-08 DIAGNOSIS — J449 Chronic obstructive pulmonary disease, unspecified: Secondary | ICD-10-CM | POA: Diagnosis not present

## 2019-03-14 DIAGNOSIS — E782 Mixed hyperlipidemia: Secondary | ICD-10-CM | POA: Diagnosis not present

## 2019-03-14 DIAGNOSIS — E8881 Metabolic syndrome: Secondary | ICD-10-CM | POA: Diagnosis not present

## 2019-03-14 DIAGNOSIS — I10 Essential (primary) hypertension: Secondary | ICD-10-CM | POA: Diagnosis not present

## 2019-03-14 DIAGNOSIS — N183 Chronic kidney disease, stage 3 (moderate): Secondary | ICD-10-CM | POA: Diagnosis not present

## 2019-03-29 DIAGNOSIS — J449 Chronic obstructive pulmonary disease, unspecified: Secondary | ICD-10-CM | POA: Diagnosis not present

## 2019-04-29 DIAGNOSIS — J449 Chronic obstructive pulmonary disease, unspecified: Secondary | ICD-10-CM | POA: Diagnosis not present

## 2019-05-29 DIAGNOSIS — J449 Chronic obstructive pulmonary disease, unspecified: Secondary | ICD-10-CM | POA: Diagnosis not present

## 2019-06-07 DIAGNOSIS — I10 Essential (primary) hypertension: Secondary | ICD-10-CM | POA: Diagnosis not present

## 2019-06-07 DIAGNOSIS — E782 Mixed hyperlipidemia: Secondary | ICD-10-CM | POA: Diagnosis not present

## 2019-06-07 DIAGNOSIS — E8881 Metabolic syndrome: Secondary | ICD-10-CM | POA: Diagnosis not present

## 2019-06-07 DIAGNOSIS — E1165 Type 2 diabetes mellitus with hyperglycemia: Secondary | ICD-10-CM | POA: Diagnosis not present

## 2019-06-13 DIAGNOSIS — J9611 Chronic respiratory failure with hypoxia: Secondary | ICD-10-CM | POA: Diagnosis not present

## 2019-06-13 DIAGNOSIS — I10 Essential (primary) hypertension: Secondary | ICD-10-CM | POA: Diagnosis not present

## 2019-06-13 DIAGNOSIS — K219 Gastro-esophageal reflux disease without esophagitis: Secondary | ICD-10-CM | POA: Diagnosis not present

## 2019-06-13 DIAGNOSIS — E782 Mixed hyperlipidemia: Secondary | ICD-10-CM | POA: Diagnosis not present

## 2019-06-19 DIAGNOSIS — I482 Chronic atrial fibrillation, unspecified: Secondary | ICD-10-CM | POA: Diagnosis not present

## 2019-06-19 DIAGNOSIS — K219 Gastro-esophageal reflux disease without esophagitis: Secondary | ICD-10-CM | POA: Diagnosis not present

## 2019-06-19 DIAGNOSIS — E039 Hypothyroidism, unspecified: Secondary | ICD-10-CM | POA: Diagnosis not present

## 2019-06-19 DIAGNOSIS — N184 Chronic kidney disease, stage 4 (severe): Secondary | ICD-10-CM | POA: Diagnosis not present

## 2019-06-29 DIAGNOSIS — J449 Chronic obstructive pulmonary disease, unspecified: Secondary | ICD-10-CM | POA: Diagnosis not present

## 2019-07-04 DIAGNOSIS — E875 Hyperkalemia: Secondary | ICD-10-CM | POA: Diagnosis not present

## 2019-07-09 DIAGNOSIS — J449 Chronic obstructive pulmonary disease, unspecified: Secondary | ICD-10-CM | POA: Diagnosis not present

## 2019-07-09 DIAGNOSIS — N184 Chronic kidney disease, stage 4 (severe): Secondary | ICD-10-CM | POA: Diagnosis not present

## 2019-07-09 DIAGNOSIS — I10 Essential (primary) hypertension: Secondary | ICD-10-CM | POA: Diagnosis not present

## 2019-07-11 DIAGNOSIS — N184 Chronic kidney disease, stage 4 (severe): Secondary | ICD-10-CM | POA: Diagnosis not present

## 2019-07-19 DIAGNOSIS — N184 Chronic kidney disease, stage 4 (severe): Secondary | ICD-10-CM | POA: Diagnosis not present

## 2019-07-30 DIAGNOSIS — J449 Chronic obstructive pulmonary disease, unspecified: Secondary | ICD-10-CM | POA: Diagnosis not present

## 2019-08-07 DIAGNOSIS — N184 Chronic kidney disease, stage 4 (severe): Secondary | ICD-10-CM | POA: Diagnosis not present

## 2019-08-07 DIAGNOSIS — I482 Chronic atrial fibrillation, unspecified: Secondary | ICD-10-CM | POA: Diagnosis not present

## 2019-08-08 ENCOUNTER — Telehealth: Payer: Self-pay | Admitting: *Deleted

## 2019-08-08 NOTE — Telephone Encounter (Signed)
-----   Message from Merlene Laughter, RN sent at 08/29/2018  9:17 AM EDT ----- Regarding: FOLLOW UP/BMET & CBC B4 NEXT VISIT 12/201/9/[place] GOING TO [place] PER SON-PROBABLY WILL BE DISCHARGED FROM NURSING CENTER BY THEN

## 2019-08-08 NOTE — Telephone Encounter (Signed)
Appt cx for 10/27/18 by son saying the appt wasn't needed. Son doesn't plan on bringing patient back to office per last phone conversation.

## 2019-08-22 DIAGNOSIS — Z23 Encounter for immunization: Secondary | ICD-10-CM | POA: Diagnosis not present

## 2019-08-29 DIAGNOSIS — J449 Chronic obstructive pulmonary disease, unspecified: Secondary | ICD-10-CM | POA: Diagnosis not present

## 2019-09-07 DIAGNOSIS — R946 Abnormal results of thyroid function studies: Secondary | ICD-10-CM | POA: Diagnosis not present

## 2019-09-07 DIAGNOSIS — N184 Chronic kidney disease, stage 4 (severe): Secondary | ICD-10-CM | POA: Diagnosis not present

## 2019-09-07 DIAGNOSIS — E1165 Type 2 diabetes mellitus with hyperglycemia: Secondary | ICD-10-CM | POA: Diagnosis not present

## 2019-09-07 DIAGNOSIS — J449 Chronic obstructive pulmonary disease, unspecified: Secondary | ICD-10-CM | POA: Diagnosis not present

## 2019-09-07 DIAGNOSIS — I482 Chronic atrial fibrillation, unspecified: Secondary | ICD-10-CM | POA: Diagnosis not present

## 2019-09-07 DIAGNOSIS — E782 Mixed hyperlipidemia: Secondary | ICD-10-CM | POA: Diagnosis not present

## 2019-09-10 DIAGNOSIS — J9611 Chronic respiratory failure with hypoxia: Secondary | ICD-10-CM | POA: Diagnosis not present

## 2019-09-10 DIAGNOSIS — F331 Major depressive disorder, recurrent, moderate: Secondary | ICD-10-CM | POA: Diagnosis not present

## 2019-09-10 DIAGNOSIS — D692 Other nonthrombocytopenic purpura: Secondary | ICD-10-CM | POA: Diagnosis not present

## 2019-09-10 DIAGNOSIS — I10 Essential (primary) hypertension: Secondary | ICD-10-CM | POA: Diagnosis not present

## 2019-10-05 DIAGNOSIS — R32 Unspecified urinary incontinence: Secondary | ICD-10-CM | POA: Diagnosis not present

## 2019-10-05 DIAGNOSIS — Z6832 Body mass index (BMI) 32.0-32.9, adult: Secondary | ICD-10-CM | POA: Diagnosis not present

## 2019-10-05 DIAGNOSIS — S8011XA Contusion of right lower leg, initial encounter: Secondary | ICD-10-CM | POA: Diagnosis not present

## 2019-11-08 DIAGNOSIS — J449 Chronic obstructive pulmonary disease, unspecified: Secondary | ICD-10-CM | POA: Diagnosis not present

## 2019-11-08 DIAGNOSIS — E782 Mixed hyperlipidemia: Secondary | ICD-10-CM | POA: Diagnosis not present

## 2019-12-08 DIAGNOSIS — S0003XA Contusion of scalp, initial encounter: Secondary | ICD-10-CM | POA: Diagnosis not present

## 2019-12-08 DIAGNOSIS — M25521 Pain in right elbow: Secondary | ICD-10-CM | POA: Diagnosis not present

## 2019-12-08 DIAGNOSIS — A403 Sepsis due to Streptococcus pneumoniae: Secondary | ICD-10-CM | POA: Diagnosis not present

## 2019-12-08 DIAGNOSIS — S299XXA Unspecified injury of thorax, initial encounter: Secondary | ICD-10-CM | POA: Diagnosis not present

## 2019-12-08 DIAGNOSIS — I4891 Unspecified atrial fibrillation: Secondary | ICD-10-CM | POA: Diagnosis not present

## 2019-12-08 DIAGNOSIS — M25522 Pain in left elbow: Secondary | ICD-10-CM | POA: Diagnosis not present

## 2019-12-08 DIAGNOSIS — N170 Acute kidney failure with tubular necrosis: Secondary | ICD-10-CM | POA: Diagnosis not present

## 2019-12-08 DIAGNOSIS — M7989 Other specified soft tissue disorders: Secondary | ICD-10-CM | POA: Diagnosis not present

## 2019-12-09 ENCOUNTER — Ambulatory Visit: Payer: Medicare Other

## 2019-12-09 DIAGNOSIS — A403 Sepsis due to Streptococcus pneumoniae: Secondary | ICD-10-CM | POA: Diagnosis not present

## 2019-12-09 DIAGNOSIS — N170 Acute kidney failure with tubular necrosis: Secondary | ICD-10-CM | POA: Diagnosis not present

## 2019-12-09 DIAGNOSIS — I4891 Unspecified atrial fibrillation: Secondary | ICD-10-CM | POA: Diagnosis not present

## 2019-12-10 DIAGNOSIS — A403 Sepsis due to Streptococcus pneumoniae: Secondary | ICD-10-CM | POA: Diagnosis not present

## 2019-12-10 DIAGNOSIS — N170 Acute kidney failure with tubular necrosis: Secondary | ICD-10-CM | POA: Diagnosis not present

## 2019-12-10 DIAGNOSIS — M7989 Other specified soft tissue disorders: Secondary | ICD-10-CM | POA: Diagnosis not present

## 2019-12-10 DIAGNOSIS — I4891 Unspecified atrial fibrillation: Secondary | ICD-10-CM | POA: Diagnosis not present

## 2019-12-11 DIAGNOSIS — A403 Sepsis due to Streptococcus pneumoniae: Secondary | ICD-10-CM | POA: Diagnosis not present

## 2019-12-11 DIAGNOSIS — N170 Acute kidney failure with tubular necrosis: Secondary | ICD-10-CM | POA: Diagnosis not present

## 2019-12-11 DIAGNOSIS — I4891 Unspecified atrial fibrillation: Secondary | ICD-10-CM | POA: Diagnosis not present

## 2019-12-12 DIAGNOSIS — A403 Sepsis due to Streptococcus pneumoniae: Secondary | ICD-10-CM | POA: Diagnosis not present

## 2019-12-12 DIAGNOSIS — I4891 Unspecified atrial fibrillation: Secondary | ICD-10-CM | POA: Diagnosis not present

## 2019-12-12 DIAGNOSIS — N170 Acute kidney failure with tubular necrosis: Secondary | ICD-10-CM | POA: Diagnosis not present

## 2019-12-13 DIAGNOSIS — R600 Localized edema: Secondary | ICD-10-CM | POA: Diagnosis not present

## 2019-12-13 DIAGNOSIS — L089 Local infection of the skin and subcutaneous tissue, unspecified: Secondary | ICD-10-CM | POA: Diagnosis not present

## 2019-12-13 DIAGNOSIS — A419 Sepsis, unspecified organism: Secondary | ICD-10-CM | POA: Diagnosis not present

## 2019-12-13 DIAGNOSIS — M7989 Other specified soft tissue disorders: Secondary | ICD-10-CM | POA: Diagnosis not present

## 2019-12-13 DIAGNOSIS — S81801A Unspecified open wound, right lower leg, initial encounter: Secondary | ICD-10-CM | POA: Diagnosis not present

## 2019-12-13 DIAGNOSIS — N170 Acute kidney failure with tubular necrosis: Secondary | ICD-10-CM | POA: Diagnosis not present

## 2019-12-13 DIAGNOSIS — I4891 Unspecified atrial fibrillation: Secondary | ICD-10-CM | POA: Diagnosis not present

## 2019-12-13 DIAGNOSIS — A403 Sepsis due to Streptococcus pneumoniae: Secondary | ICD-10-CM | POA: Diagnosis not present

## 2019-12-13 DIAGNOSIS — M79671 Pain in right foot: Secondary | ICD-10-CM | POA: Diagnosis not present

## 2019-12-13 DIAGNOSIS — I517 Cardiomegaly: Secondary | ICD-10-CM | POA: Diagnosis not present

## 2019-12-13 DIAGNOSIS — S81802A Unspecified open wound, left lower leg, initial encounter: Secondary | ICD-10-CM | POA: Diagnosis not present

## 2019-12-14 DIAGNOSIS — D649 Anemia, unspecified: Secondary | ICD-10-CM | POA: Diagnosis not present

## 2019-12-14 DIAGNOSIS — N170 Acute kidney failure with tubular necrosis: Secondary | ICD-10-CM | POA: Diagnosis not present

## 2019-12-14 DIAGNOSIS — Z452 Encounter for adjustment and management of vascular access device: Secondary | ICD-10-CM | POA: Diagnosis not present

## 2019-12-14 DIAGNOSIS — J449 Chronic obstructive pulmonary disease, unspecified: Secondary | ICD-10-CM | POA: Diagnosis not present

## 2019-12-14 DIAGNOSIS — A403 Sepsis due to Streptococcus pneumoniae: Secondary | ICD-10-CM | POA: Diagnosis not present

## 2019-12-14 DIAGNOSIS — I4891 Unspecified atrial fibrillation: Secondary | ICD-10-CM | POA: Diagnosis not present

## 2019-12-14 DIAGNOSIS — R918 Other nonspecific abnormal finding of lung field: Secondary | ICD-10-CM | POA: Diagnosis not present

## 2019-12-14 DIAGNOSIS — L02611 Cutaneous abscess of right foot: Secondary | ICD-10-CM | POA: Diagnosis not present

## 2019-12-15 DIAGNOSIS — I4891 Unspecified atrial fibrillation: Secondary | ICD-10-CM | POA: Diagnosis not present

## 2019-12-15 DIAGNOSIS — N170 Acute kidney failure with tubular necrosis: Secondary | ICD-10-CM | POA: Diagnosis not present

## 2019-12-15 DIAGNOSIS — A403 Sepsis due to Streptococcus pneumoniae: Secondary | ICD-10-CM | POA: Diagnosis not present

## 2019-12-16 DIAGNOSIS — I4891 Unspecified atrial fibrillation: Secondary | ICD-10-CM | POA: Diagnosis not present

## 2019-12-16 DIAGNOSIS — N170 Acute kidney failure with tubular necrosis: Secondary | ICD-10-CM | POA: Diagnosis not present

## 2019-12-16 DIAGNOSIS — A403 Sepsis due to Streptococcus pneumoniae: Secondary | ICD-10-CM | POA: Diagnosis not present

## 2019-12-17 DIAGNOSIS — N170 Acute kidney failure with tubular necrosis: Secondary | ICD-10-CM | POA: Diagnosis not present

## 2019-12-17 DIAGNOSIS — A403 Sepsis due to Streptococcus pneumoniae: Secondary | ICD-10-CM | POA: Diagnosis not present

## 2019-12-17 DIAGNOSIS — I4891 Unspecified atrial fibrillation: Secondary | ICD-10-CM | POA: Diagnosis not present

## 2019-12-18 DIAGNOSIS — N170 Acute kidney failure with tubular necrosis: Secondary | ICD-10-CM | POA: Diagnosis not present

## 2019-12-18 DIAGNOSIS — I4891 Unspecified atrial fibrillation: Secondary | ICD-10-CM | POA: Diagnosis not present

## 2019-12-18 DIAGNOSIS — A403 Sepsis due to Streptococcus pneumoniae: Secondary | ICD-10-CM | POA: Diagnosis not present

## 2019-12-18 IMAGING — DX DG CHEST 1V PORT
1 series · 1 of 1 positions shown · non-contrast
Comparison: 08/05/2018, 08/04/2018

CLINICAL DATA: Central venous catheter placement

EXAM:
PORTABLE CHEST 1 VIEW

[chest]
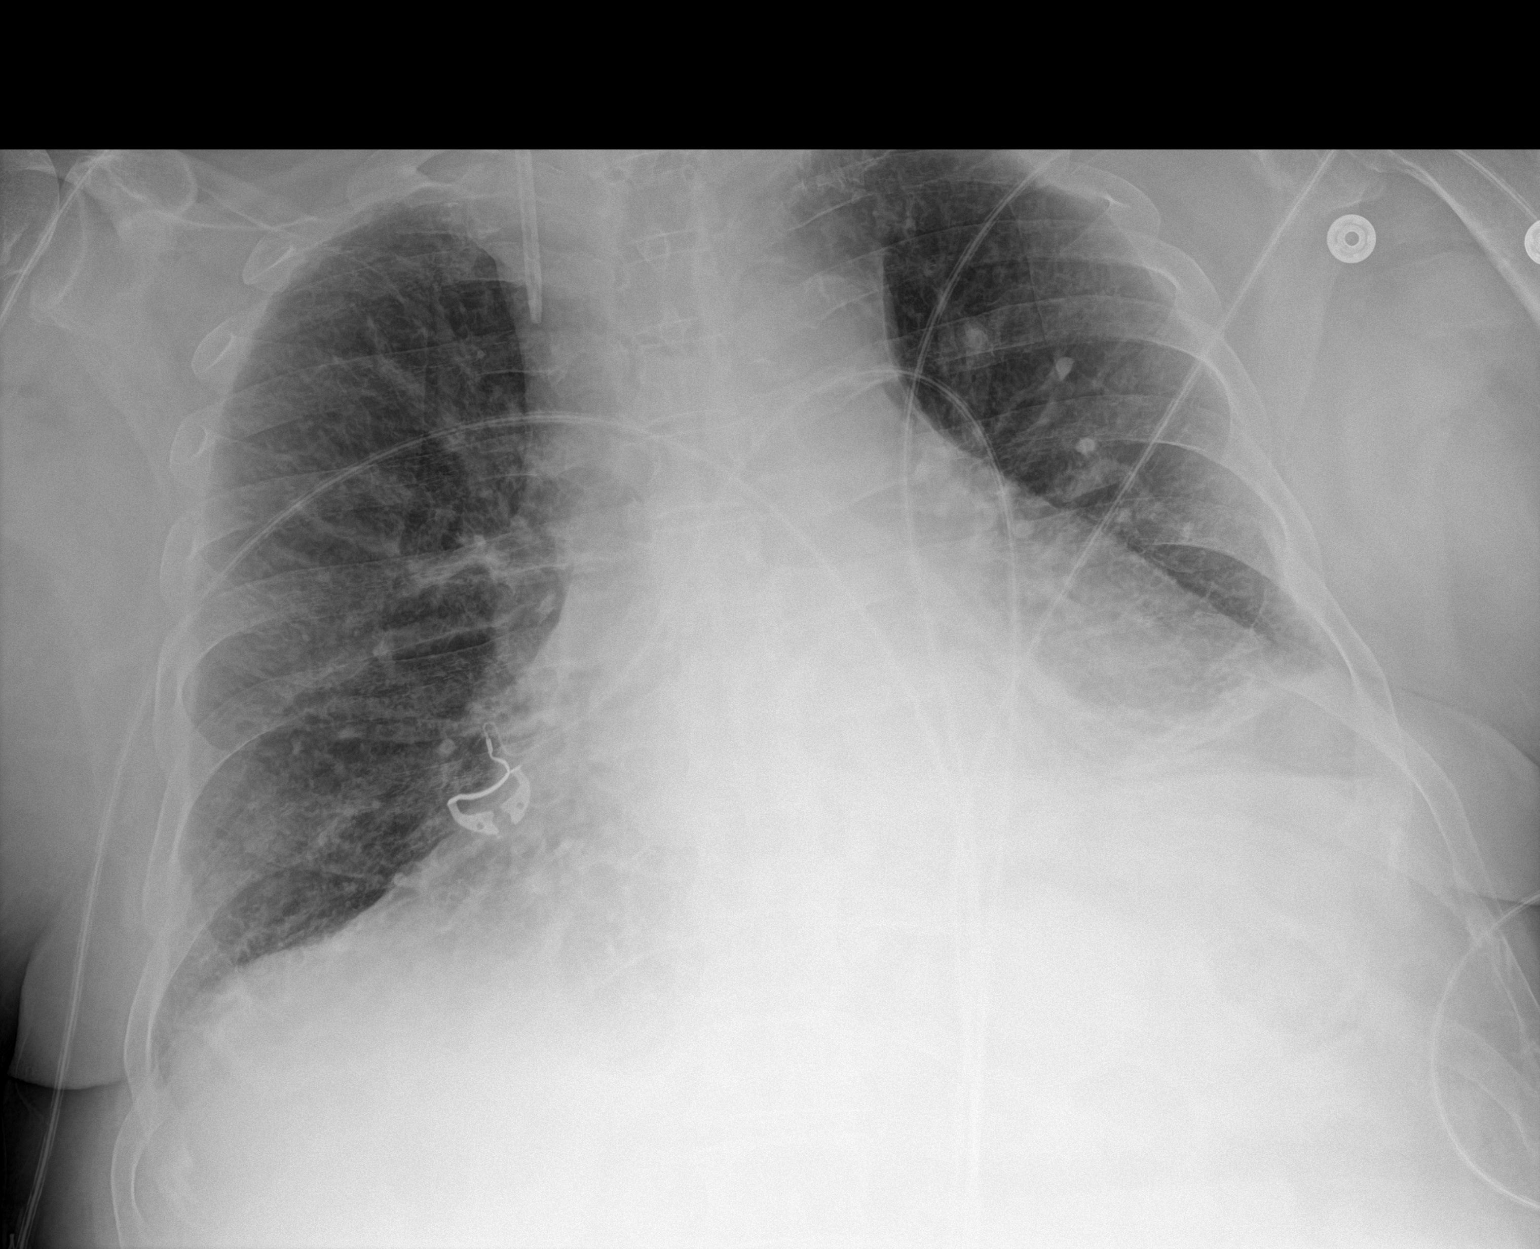

[1 of 1 positions shown; findings below may reference images not displayed]

FINDINGS: Placement of right-sided central venous catheter with tip
superimposing the jugular brachiocephalic confluence. No right
pneumothorax.

Continued cardiomegaly with streaky atelectasis or infiltrate at the
left base, slightly improved.
IMPRESSION: 1. Right central venous catheter tip overlies the
jugular/brachiocephalic confluence. There is no right pneumothorax
2. Cardiomegaly. Streaky atelectasis or infiltrate at the left base
with some improvement in aeration.

## 2019-12-20 ENCOUNTER — Ambulatory Visit: Payer: Medicare Other

## 2019-12-20 IMAGING — DX DG CHEST 1V PORT
1 series · 1 of 1 positions shown · non-contrast
Comparison: 08/05/2018

CLINICAL DATA: Community acquired pneumonia

EXAM:
PORTABLE CHEST 1 VIEW

[chest]
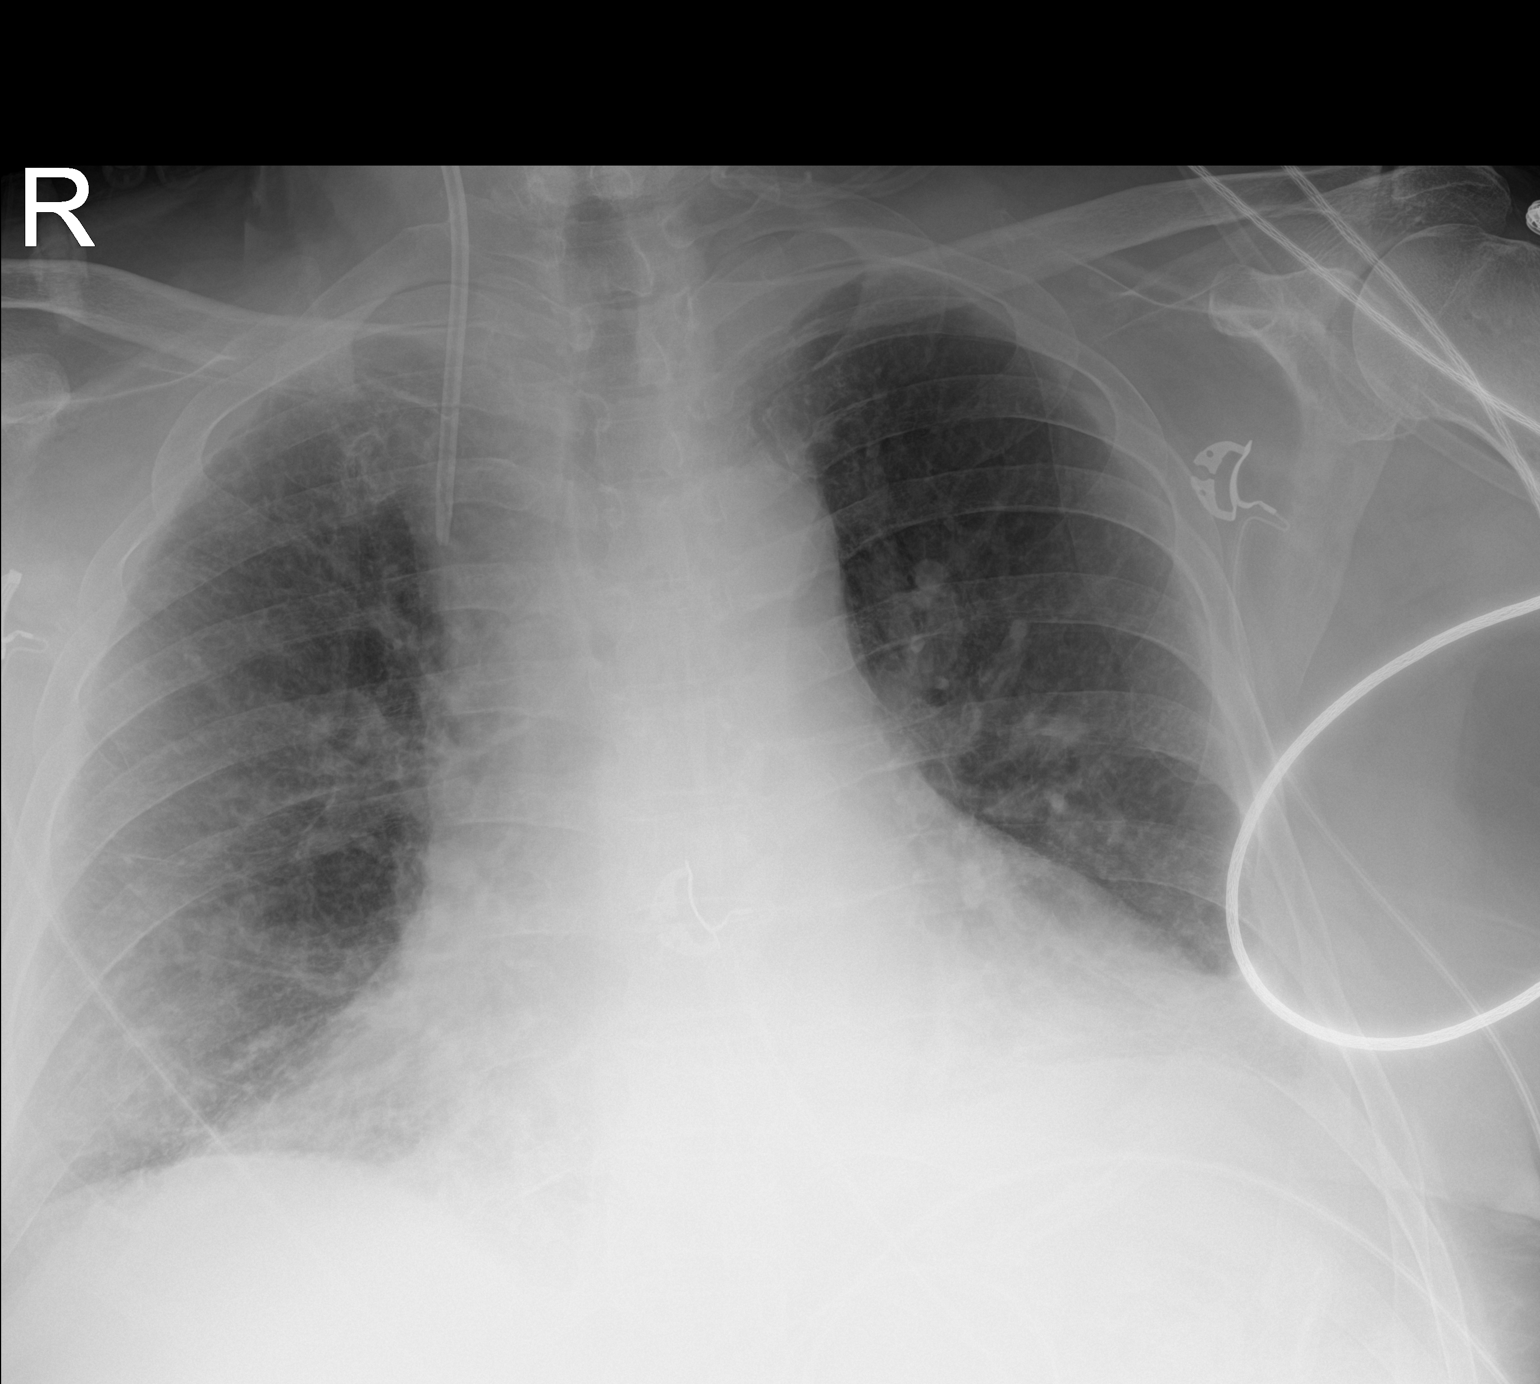

[1 of 1 positions shown; findings below may reference images not displayed]

FINDINGS: Right Vas-Cath remains in place, unchanged. Cardiomegaly. Bibasilar
atelectasis. Mild vascular congestion. No visible effusions or acute
bony abnormality.
IMPRESSION: Cardiomegaly with vascular congestion.  Bibasilar atelectasis.

## 2019-12-20 IMAGING — CT CT HEAD W/O CM
4 series · 17 of 47 positions shown, 19 images · non-contrast
Comparison: None.

CLINICAL DATA: Confusion and ataxia. Head trauma. History of
hypertension, encephalopathy.

EXAM:
CT HEAD WITHOUT CONTRAST
TECHNIQUE: Contiguous axial images were obtained from the base of the skull
through the vertex without intravenous contrast.

[Series 3: head without · axial · non-contrast · 0.46mm/px · z∈[+1158,+1278]mm · 6 of 34 slices shown, 8 images]
[im 5/34  brain]
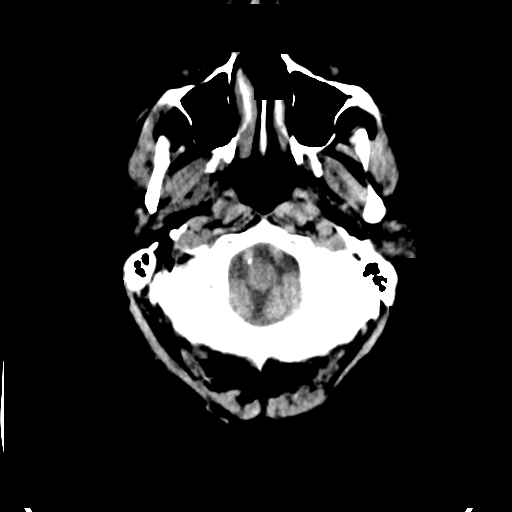
[im 5/34  bone]
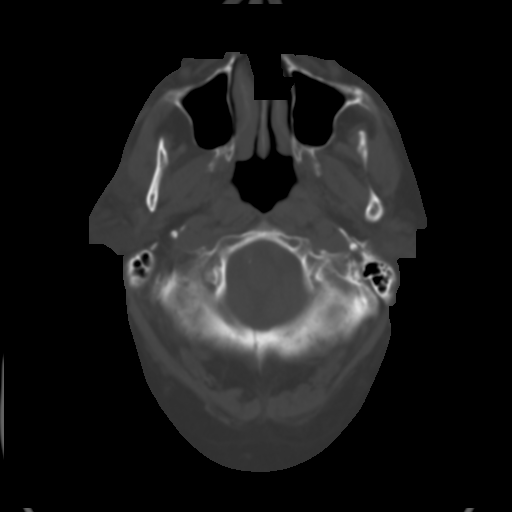
[im 10/34  brain]
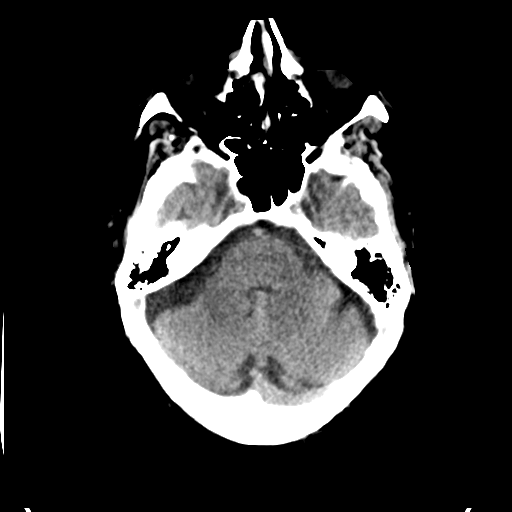
[im 15/34  brain]
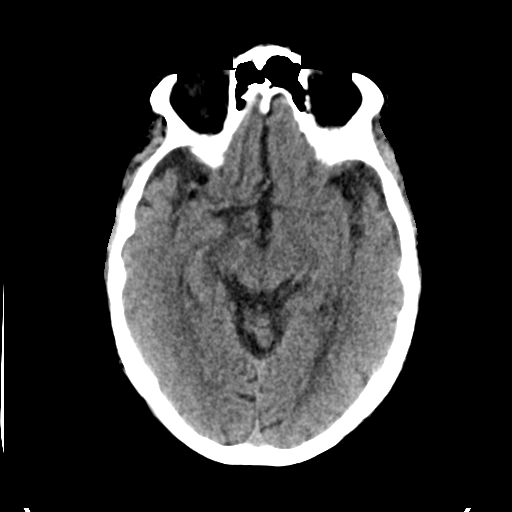
[im 19/34  brain]
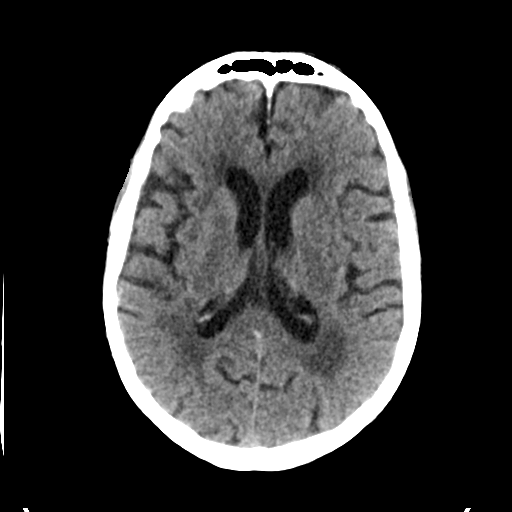
[im 24/34  brain]
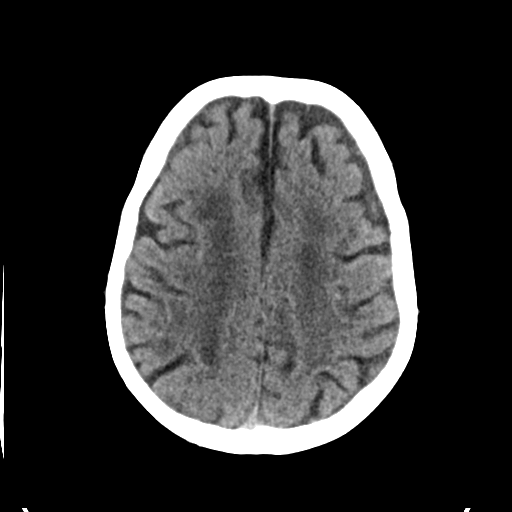
[im 24/34  bone]
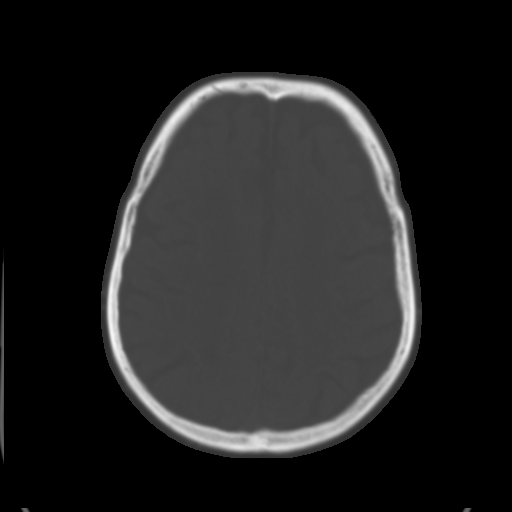
[im 29/34  brain]
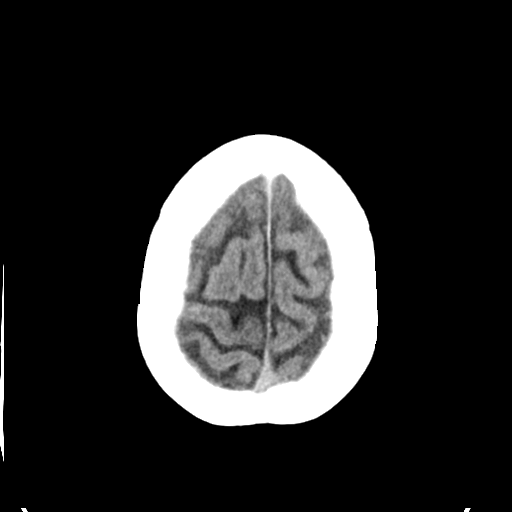

[Series 4: head bone · axial · 0.46mm/px · z∈[+1154,+1234]mm · 5 of 86 slices shown]
[im 9/86  bone]
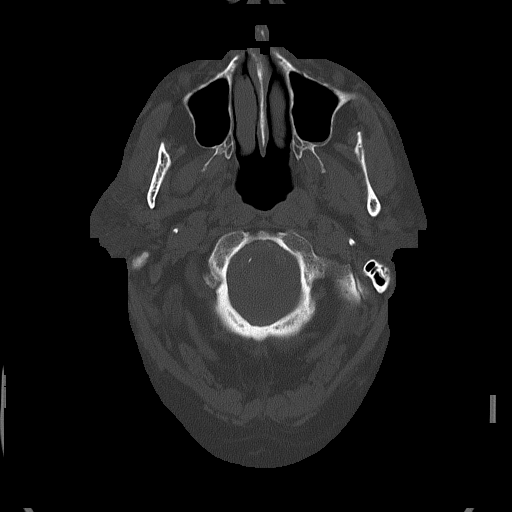
[im 17/86  bone]
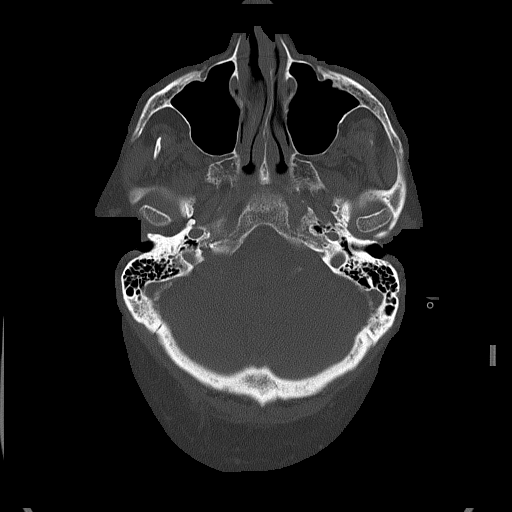
[im 29/86  bone]
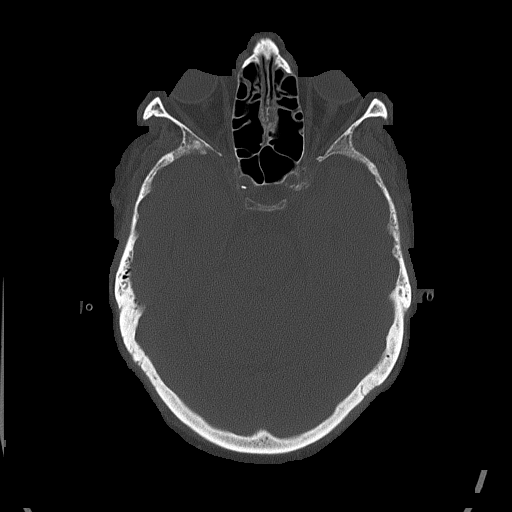
[im 37/86  bone]
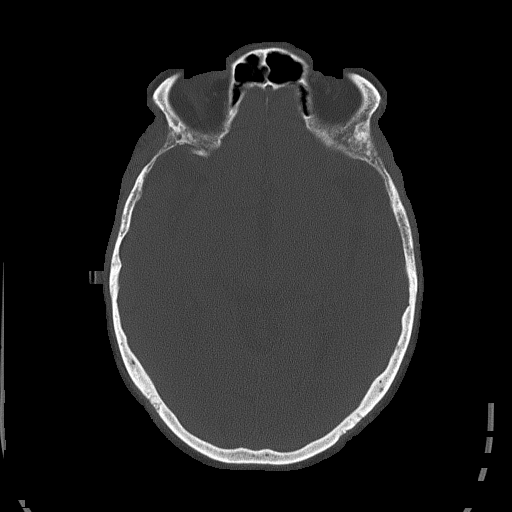
[im 49/86  bone]
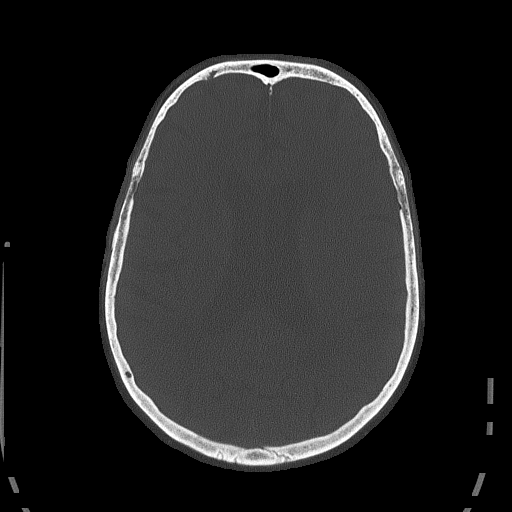

[Series 5: head without cor · coronal · non-contrast · 0.32mm/px · 3 of 74 slices shown]
[im 25/74  brain]
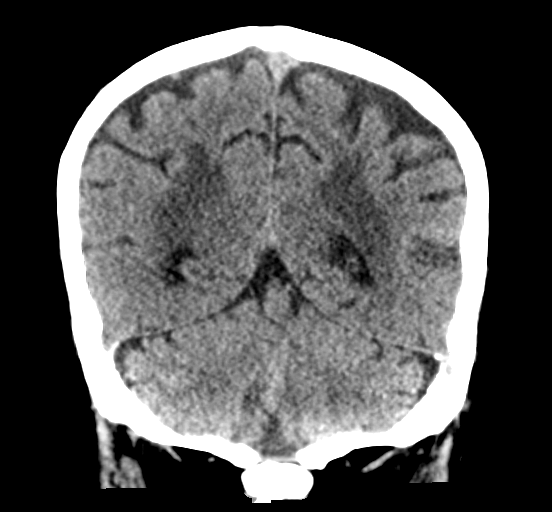
[im 33/74  brain]
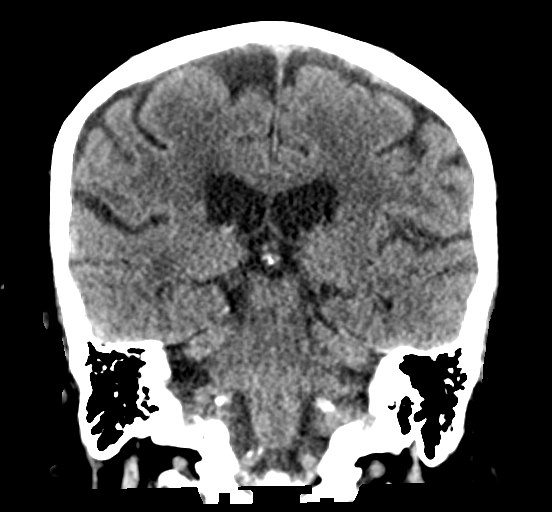
[im 41/74  brain]
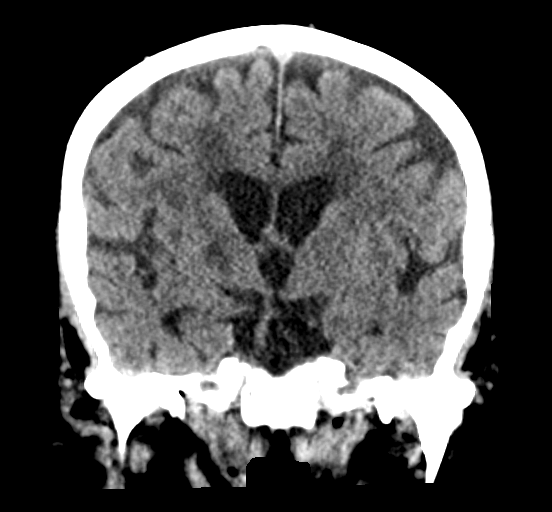

[Series 6: head without sag · sagittal · non-contrast · 0.36mm/px · 3 of 57 slices shown]
[im 19/57  brain]
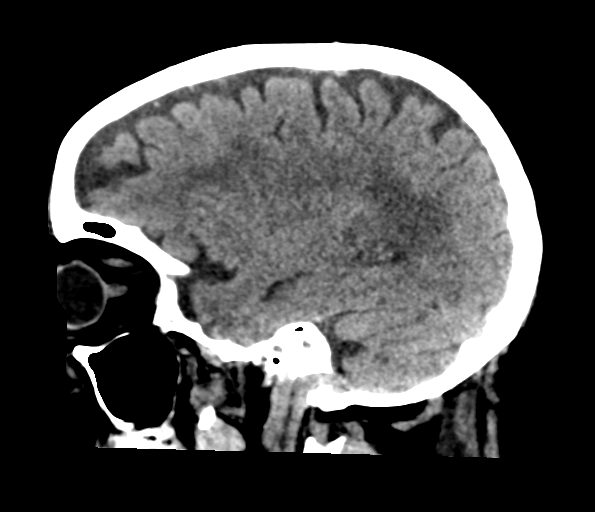
[im 29/57  brain]
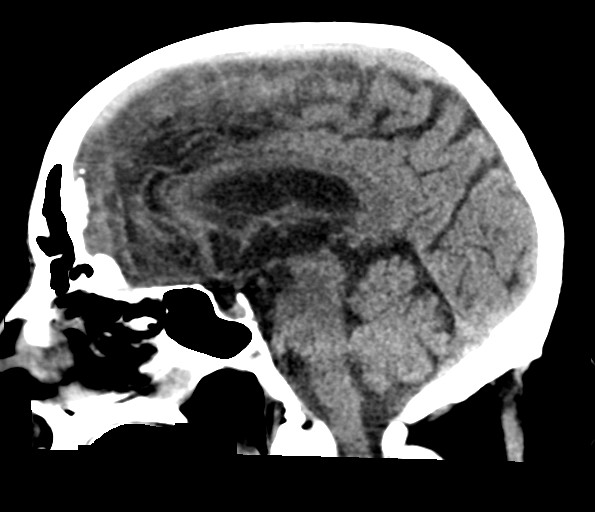
[im 38/57  brain]
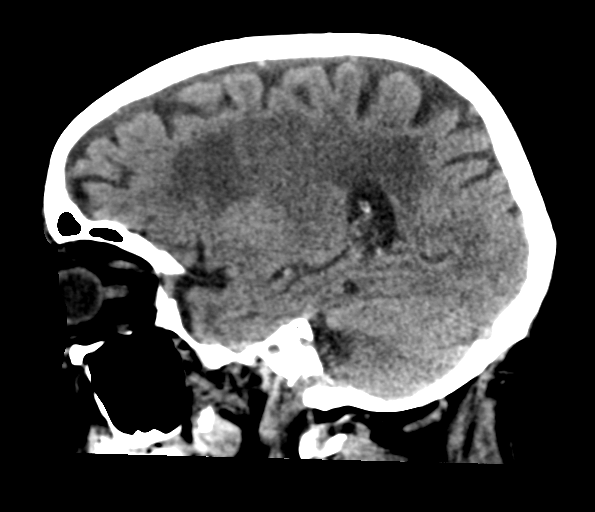

[17 of 47 positions shown; findings below may reference images not displayed]

FINDINGS: BRAIN: No intraparenchymal hemorrhage, mass effect nor midline
shift. The ventricles and sulci are normal for age. Patchy to
confluent supratentorial white matter hypodensities. 1 cm
hypodensity RIGHT inferior basal ganglia. No acute large vascular
territory infarcts. No abnormal extra-axial fluid collections. Basal
cisterns are patent.

VASCULAR: Moderate calcific atherosclerosis of the carotid siphons.

SKULL: No skull fracture. No significant scalp soft tissue swelling.

SINUSES/ORBITS: Trace paranasal sinus mucosal thickening. Mastoid
air cells are well aerated.The included ocular globes and orbital
contents are non-suspicious. Status post LEFT ocular lens implant.

OTHER: None.
IMPRESSION: 1. No acute intracranial process.
2. Moderate to severe chronic small vessel ischemic changes. RIGHT
basal ganglia lacunar infarct versus perivascular space.

## 2020-01-07 DEATH — deceased

## 2020-06-02 IMAGING — US US RENAL
1 series · 14 of 25 positions shown · non-contrast
Comparison: None.

CLINICAL DATA: Acute kidney injury

EXAM:
RENAL / URINARY TRACT ULTRASOUND COMPLETE

[Series 1: us renal · 0.30mm/px · 14 of 26 slices shown]
[im 1/26]
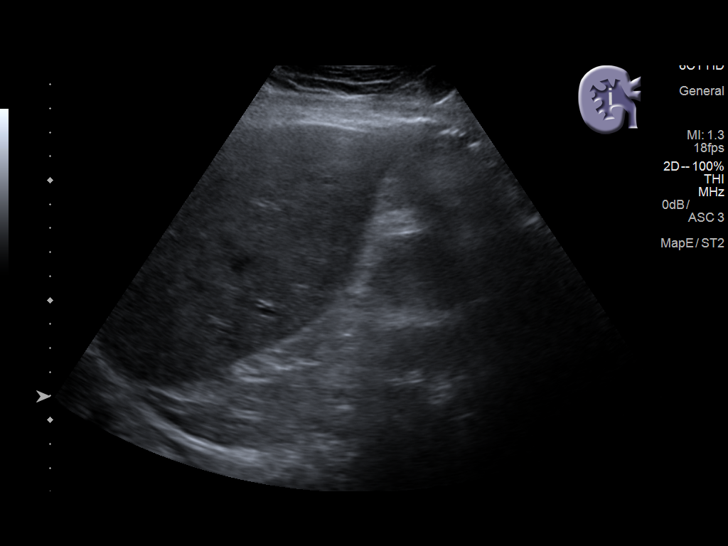
[im 3/26]
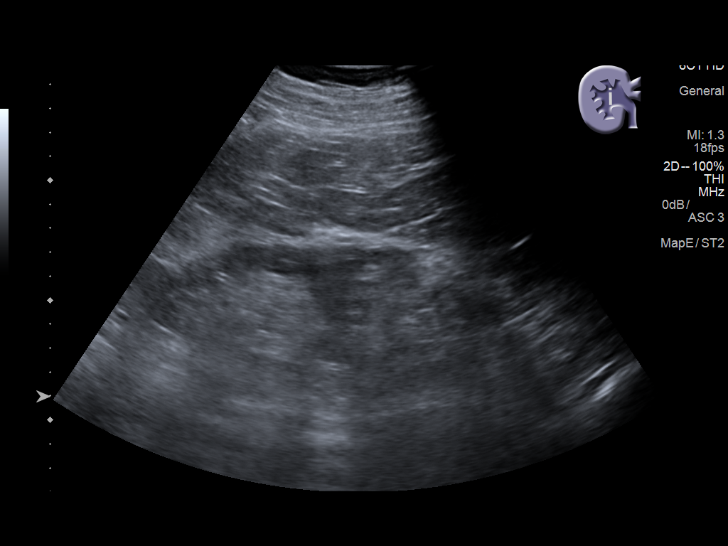
[im 5/26]
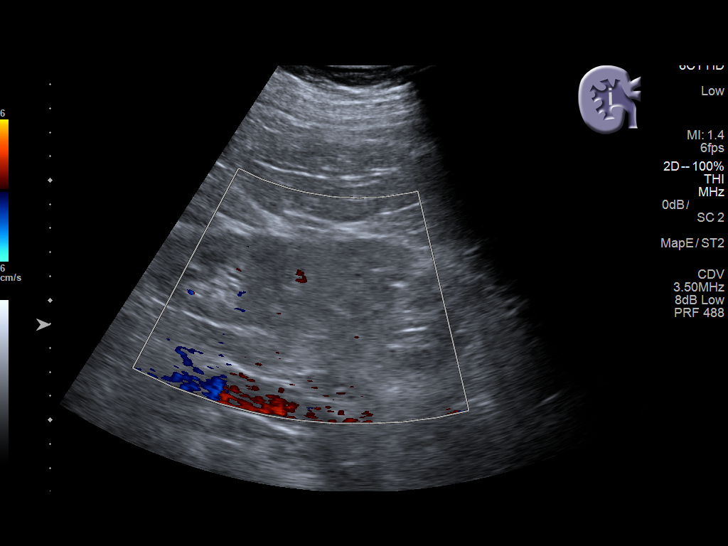
[im 7/26]
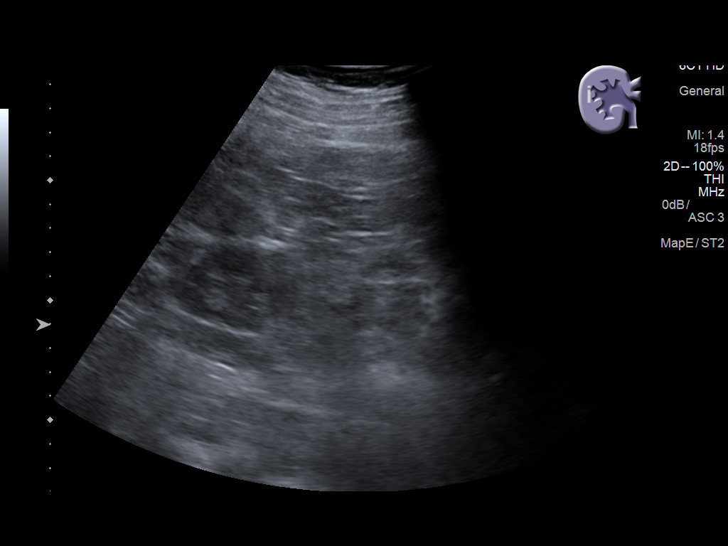
[im 9/26]
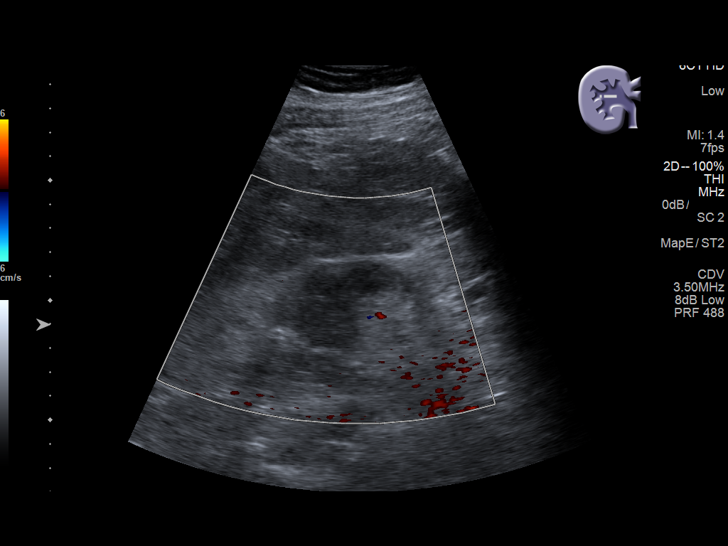
[im 10/26]
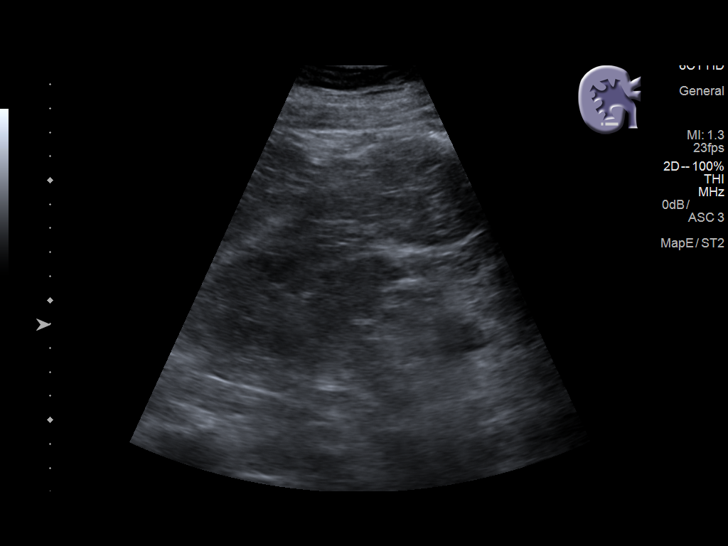
[im 12/26]
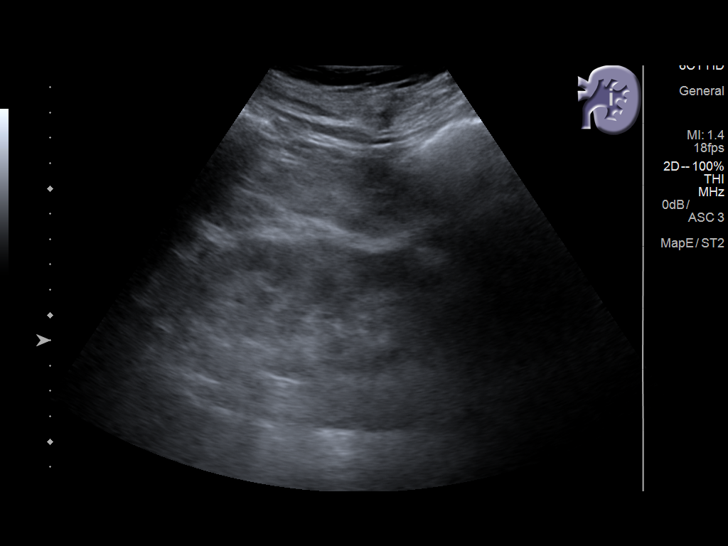
[im 14/26]
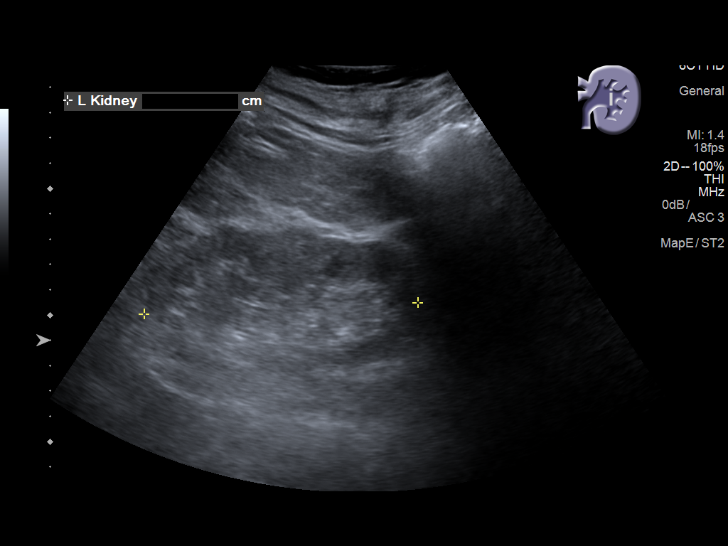
[im 16/26]
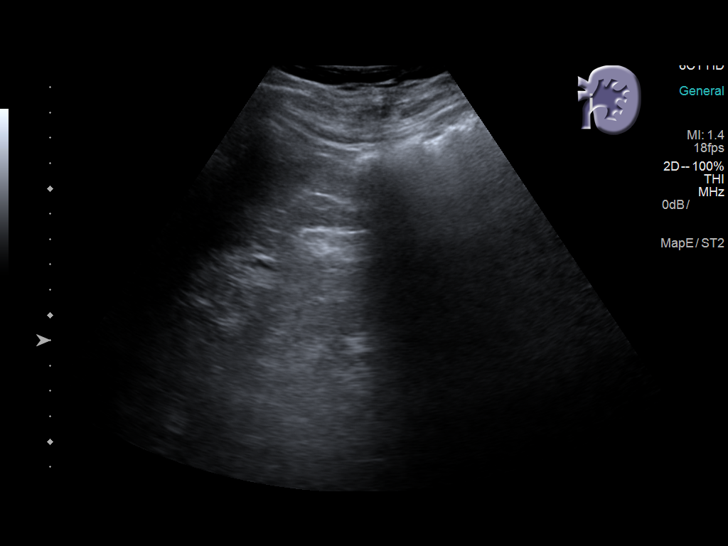
[im 17/26]
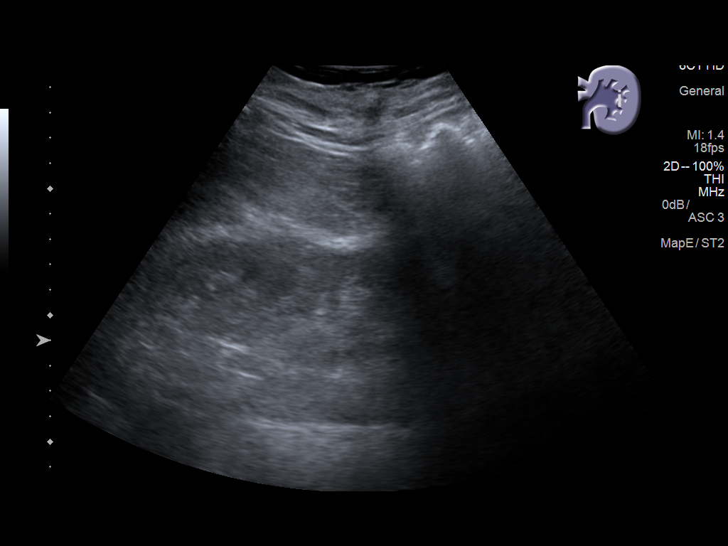
[im 19/26]
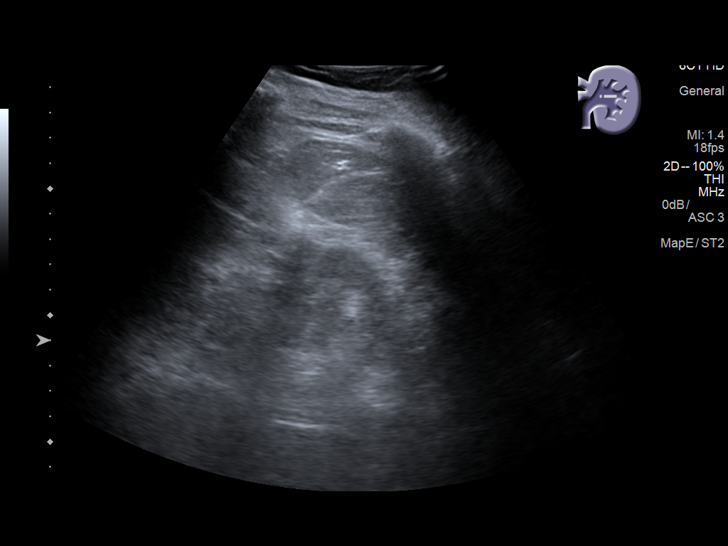
[im 21/26]
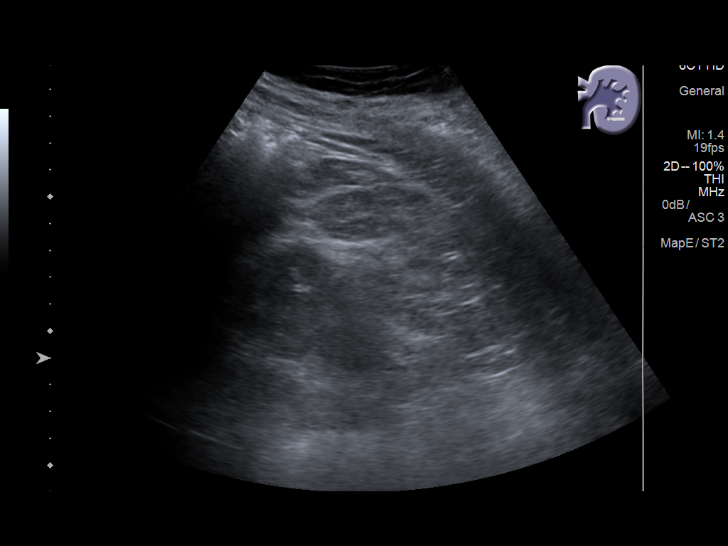
[im 23/26]
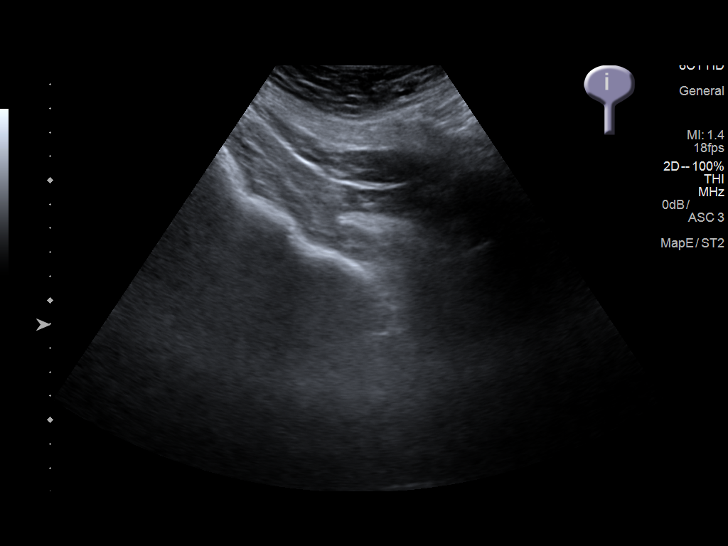
[im 26/26]
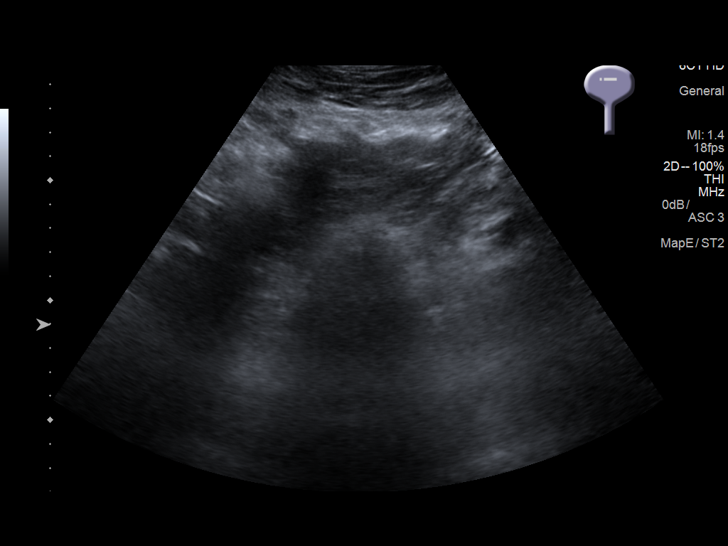

[14 of 25 positions shown; findings below may reference images not displayed]

FINDINGS: Right Kidney:

Length: 12.9 cm. Echogenic renal parenchyma. Cortical thinning. No
mass or hydronephrosis.

Left Kidney:

Length: 10.8 cm. Echogenic renal parenchyma. Cortical
thinning/atrophy. No mass or hydronephrosis.

Bladder:

Not discretely visualized/underdistended.
IMPRESSION: Bilateral renal cortical thinning/atrophy, left greater than right.
Echogenic renal parenchyma, suggesting medical renal disease.

No hydronephrosis.

Bladder is not discretely visualized/underdistended.
